# Patient Record
Sex: Male | Born: 1955 | Race: White | Hispanic: No | Marital: Married | Smoking: Never smoker
Health system: Southern US, Community
[De-identification: ages and names within clinical notes are randomized; demographics above are authoritative.]

## PROBLEM LIST (undated history)

## (undated) DIAGNOSIS — Z973 Presence of spectacles and contact lenses: Secondary | ICD-10-CM

## (undated) DIAGNOSIS — E039 Hypothyroidism, unspecified: Secondary | ICD-10-CM

## (undated) DIAGNOSIS — E785 Hyperlipidemia, unspecified: Secondary | ICD-10-CM

## (undated) DIAGNOSIS — M1990 Unspecified osteoarthritis, unspecified site: Secondary | ICD-10-CM

## (undated) DIAGNOSIS — I10 Essential (primary) hypertension: Secondary | ICD-10-CM

## (undated) DIAGNOSIS — Z87442 Personal history of urinary calculi: Secondary | ICD-10-CM

## (undated) HISTORY — DX: Unspecified osteoarthritis, unspecified site: M19.90

## (undated) HISTORY — PX: URETEROSCOPY: SHX842

## (undated) HISTORY — DX: Hyperlipidemia, unspecified: E78.5

## (undated) HISTORY — PX: TONSILLECTOMY: SUR1361

## (undated) HISTORY — DX: Essential (primary) hypertension: I10

## (undated) HISTORY — PX: COLONOSCOPY: SHX174

---

## 2000-08-11 ENCOUNTER — Encounter: Payer: Self-pay | Admitting: Emergency Medicine

## 2000-08-11 ENCOUNTER — Emergency Department (HOSPITAL_COMMUNITY): Admission: EM | Admit: 2000-08-11 | Discharge: 2000-08-11 | Payer: Self-pay | Admitting: Emergency Medicine

## 2002-05-23 ENCOUNTER — Emergency Department (HOSPITAL_COMMUNITY): Admission: EM | Admit: 2002-05-23 | Discharge: 2002-05-23 | Payer: Self-pay | Admitting: Emergency Medicine

## 2002-05-23 ENCOUNTER — Encounter: Payer: Self-pay | Admitting: Emergency Medicine

## 2005-12-12 ENCOUNTER — Emergency Department (HOSPITAL_COMMUNITY): Admission: EM | Admit: 2005-12-12 | Discharge: 2005-12-12 | Payer: Self-pay | Admitting: Emergency Medicine

## 2007-04-01 HISTORY — PX: HERNIA REPAIR: SHX51

## 2007-04-14 ENCOUNTER — Emergency Department (HOSPITAL_COMMUNITY): Admission: EM | Admit: 2007-04-14 | Discharge: 2007-04-14 | Payer: Self-pay | Admitting: Emergency Medicine

## 2007-04-15 ENCOUNTER — Ambulatory Visit: Payer: Self-pay | Admitting: Vascular Surgery

## 2007-04-15 ENCOUNTER — Encounter (INDEPENDENT_AMBULATORY_CARE_PROVIDER_SITE_OTHER): Payer: Self-pay | Admitting: Emergency Medicine

## 2007-04-15 ENCOUNTER — Ambulatory Visit (HOSPITAL_COMMUNITY): Admission: RE | Admit: 2007-04-15 | Discharge: 2007-04-15 | Payer: Self-pay | Admitting: Emergency Medicine

## 2007-11-25 ENCOUNTER — Ambulatory Visit (HOSPITAL_COMMUNITY): Admission: RE | Admit: 2007-11-25 | Discharge: 2007-11-25 | Payer: Self-pay | Admitting: General Surgery

## 2008-02-02 ENCOUNTER — Encounter: Admission: RE | Admit: 2008-02-02 | Discharge: 2008-02-02 | Payer: Self-pay | Admitting: Rheumatology

## 2008-02-03 ENCOUNTER — Encounter: Admission: RE | Admit: 2008-02-03 | Discharge: 2008-02-03 | Payer: Self-pay | Admitting: Rheumatology

## 2008-02-22 ENCOUNTER — Encounter: Admission: RE | Admit: 2008-02-22 | Discharge: 2008-02-22 | Payer: Self-pay | Admitting: Rheumatology

## 2010-04-19 ENCOUNTER — Emergency Department (HOSPITAL_COMMUNITY)
Admission: EM | Admit: 2010-04-19 | Discharge: 2010-04-19 | Payer: Self-pay | Source: Home / Self Care | Admitting: Emergency Medicine

## 2010-04-22 LAB — CBC
HCT: 44.3 % (ref 39.0–52.0)
MCV: 84.9 fL (ref 78.0–100.0)
Platelets: 185 10*3/uL (ref 150–400)
RBC: 5.22 MIL/uL (ref 4.22–5.81)
WBC: 11.2 10*3/uL — ABNORMAL HIGH (ref 4.0–10.5)

## 2010-04-22 LAB — BASIC METABOLIC PANEL
Calcium: 9.1 mg/dL (ref 8.4–10.5)
Chloride: 107 mEq/L (ref 96–112)
GFR calc non Af Amer: 57 mL/min — ABNORMAL LOW (ref >60)
Glucose, Bld: 106 mg/dL — ABNORMAL HIGH (ref 70–99)
Potassium: 4.3 mEq/L (ref 3.5–5.1)

## 2010-04-22 LAB — DIFFERENTIAL
Lymphocytes Relative: 20 % (ref 12–46)
Lymphs Abs: 2.2 10*3/uL (ref 0.7–4.0)
Neutrophils Relative %: 65 % (ref 43–77)

## 2010-04-22 LAB — URINALYSIS, ROUTINE W REFLEX MICROSCOPIC
Bilirubin Urine: NEGATIVE
Ketones, ur: NEGATIVE mg/dL
Urine Glucose, Fasting: NEGATIVE mg/dL
pH: 6 (ref 5.0–8.0)

## 2010-04-22 LAB — URINE MICROSCOPIC-ADD ON

## 2010-08-13 NOTE — Op Note (Signed)
[name], [name]                ACCOUNT NO.:  [id number]   MEDICAL RECORD NO.:  [id number]          PATIENT TYPE:  AMB   LOCATION:  SDS                          FACILITY:  [facility]   PHYSICIAN:  Gabrielle Dare. Janee Morn, M.D.DATE OF BIRTH:  Mar 06, 1956   DATE OF PROCEDURE:  11/25/2007  DATE OF DISCHARGE:  11/25/2007                               OPERATIVE REPORT   PREOPERATIVE DIAGNOSIS:  Umbilical hernia.   POSTOPERATIVE DIAGNOSIS:  Umbilical hernia.   PROCEDURE:  Repair of umbilical hernia with mesh.   SURGEON:  Gabrielle Dare. Janee Morn, MD   ANESTHESIA:  General with laryngeal mask airway   HISTORY OF PRESENT ILLNESS:  Joseph Noble is a 55 year old gentleman who I  evaluated in the office for a symptomatic umbilical hernia.  He presents  today for elective repair with mesh.   PROCEDURE IN DETAIL:  Informed consent was obtained after identifying  the patient in preop holding area.  He received intravenous antibiotics.  He was brought to the operating room.  General anesthesia with laryngeal  mask airway was administered by the anesthesia staff.  His abdomen was  prepped and draped in a sterile fashion.  The infraumbilical area was  infiltrated with 0.25% Marcaine with epinephrine and infraumbilical  incision was made.  Subcutaneous tissues were dissected down and around  the umbilicus.  The umbilicus was circumferentially dissected.  The  umbilical skin was then carefully dissected off of the underlying hernia  sac.  The hernia sac was identified.  It contained a significant portion  of omentum.  The hernia sac was excised and discarded.  The omentum was  a bit unhealthy at the outer tips, so 2 small areas were clamped with  hemostats, excised, and tied with 2-0 Vicryl to achieve excellent  hemostasis.  Remainder of the omentum was viable and reduced easily back  into the abdomen.  The fascia was cleared off circumferentially above  and below to facilitate our inlay mesh repair.  The repair  was then done  with an inlay polypropylene mesh.  This was fashioned to shape to allow  at least a 1.5-cm circumferential inlay overlap.  The fascial defect  itself was only about a centimeter across.  The mesh was then secured in  an inlay fashion with interrupted 0 Prolene sutures.  Two additional  sutures were placed closing the fascia over the top of the mesh and  tacking this down to the mesh as well, achieving excellent closure.  Additional local anesthetic was injected.  The area was copiously  irrigated.  Meticulous hemostasis was obtained.  The umbilicus was then  tacked back down to the fascia with interrupted 2-0 Vicryl sutures.  Subcutaneous tissues were closed with interrupted 3-0  Vicryl sutures and the skin was closed with a running 4-0 Monocryl  subcuticular stitch.  Sponge, needle, and instrument counts were  correct.  Benzoin, Steri-Strips, cotton ball, and a sterile dressing  were applied.  The patient tolerated the procedure well without apparent  complication and was taken to recovery room in stable condition.      Gabrielle Dare Janee Morn, M.D.  Electronically Signed     BET/MEDQ  D:  11/25/2007  T:  11/25/2007  Job:  [id number]   cc:   Vikki Ports, M.D.

## 2013-04-07 ENCOUNTER — Telehealth (INDEPENDENT_AMBULATORY_CARE_PROVIDER_SITE_OTHER): Payer: Self-pay

## 2013-04-07 ENCOUNTER — Ambulatory Visit (INDEPENDENT_AMBULATORY_CARE_PROVIDER_SITE_OTHER): Payer: Managed Care, Other (non HMO) | Admitting: General Surgery

## 2013-04-07 ENCOUNTER — Encounter (INDEPENDENT_AMBULATORY_CARE_PROVIDER_SITE_OTHER): Payer: Self-pay

## 2013-04-07 ENCOUNTER — Encounter (INDEPENDENT_AMBULATORY_CARE_PROVIDER_SITE_OTHER): Payer: Self-pay | Admitting: General Surgery

## 2013-04-07 VITALS — BP 140/96 | HR 64 | Temp 98.2°F | Resp 14 | Ht 74.5 in | Wt 282.0 lb

## 2013-04-07 DIAGNOSIS — K4090 Unilateral inguinal hernia, without obstruction or gangrene, not specified as recurrent: Secondary | ICD-10-CM

## 2013-04-07 MED ORDER — HYDROCODONE-ACETAMINOPHEN 10-325 MG PO TABS: 1.0000 | ORAL_TABLET | Freq: Four times a day (QID) | ORAL | Status: DC | PRN

## 2013-04-07 NOTE — Patient Instructions (Signed)
Please call and ask for scheduling

## 2013-04-07 NOTE — Progress Notes (Signed)
Patient ID: Joseph Noble, male   DOB: Jan 31, 1956, 58 y.o.   MRN: [id number]  Chief Complaint  Patient presents with  . New Evaluation    eval possible hernia also ventral diastasis    HPI Joseph Noble is a 58 y.o. male.  Right groin pain after lifting HPI Patient is well-known to me status post umbilical hernia repair with mesh in the past. He has also had several episodes of kidney stones. On a CAT scan in 2012 which he underwent to evaluate his kidney stones, There is a right inguinal hernia present containing fat and a small portion of the bladder. Since that time, he occasionally has pain in his right groin, especially after lifting. He works in [employer] so heavy lifting is often required. When he develops the pain in his groin, he rests after work and applies either ice or heat. This resolves his symptoms. He does notice a swelling in the area at times. When he has the groin pain, he also at times has supraumbilical abdominal pain. This also resolved spontaneously. No constipation. No ongoing urinary symptoms. I was asked to see him in consultation by Dr. Tenny Crawoss regarding possible hernia. Past Medical History  Diagnosis Date  . Arthritis   . Hyperlipidemia   . Hypertension     Past Surgical History  Procedure Laterality Date  . Hernia repair      Family History  Problem Relation Age of Onset  . Cancer Mother     leukemia    Social History History  Substance Use Topics  . Smoking status: Never Smoker   . Smokeless tobacco: Current User    Types: Chew  . Alcohol Use: No    Allergies  Allergen Reactions  . Codeine Itching and Rash    Current Outpatient Prescriptions  Medication Sig Dispense Refill  . allopurinol (ZYLOPRIM) 300 MG tablet       . HYDROcodone-acetaminophen (NORCO) 10-325 MG per tablet Take 1 tablet by mouth every 6 (six) hours as needed (pain).  30 tablet  0  . ibuprofen (ADVIL,MOTRIN) 200 MG tablet Take 200 mg by mouth every 6 (six) hours  as needed.      Marland Kitchen. lisinopril (PRINIVIL,ZESTRIL) 40 MG tablet       . meloxicam (MOBIC) 7.5 MG tablet       . metoprolol succinate (TOPROL-XL) 100 MG 24 hr tablet       . simvastatin (ZOCOR) 40 MG tablet       . traMADol (ULTRAM) 50 MG tablet        No current facility-administered medications for this visit.    Review of Systems Review of Systems  Constitutional: Negative for fever, chills and unexpected weight change.  HENT: Negative for congestion, hearing loss, sore throat, trouble swallowing and voice change.   Eyes: Negative for visual disturbance.  Respiratory: Negative for cough and wheezing.   Cardiovascular: Negative for chest pain, palpitations and leg swelling.  Gastrointestinal: Positive for abdominal pain. Negative for nausea, diarrhea, constipation, blood in stool, anal bleeding and rectal pain.       See history of present illness  Genitourinary: Negative for urgency, frequency, hematuria and difficulty urinating.       No frequency or urgency since kidney stones have resolved  Musculoskeletal: Positive for back pain. Negative for arthralgias.  Skin: Negative for rash and wound.  Neurological: Negative for seizures, syncope, weakness and headaches.  Hematological: Negative for adenopathy. Does not bruise/bleed easily.  Psychiatric/Behavioral: Negative for confusion.  Blood pressure 140/96, pulse 64, temperature 98.2 F (36.8 C), temperature source Temporal, resp. rate 14, height 6' 2.5" (1.892 m), weight 282 lb (127.914 kg).  Physical Exam Physical Exam  Constitutional: He is oriented to person, place, and time. He appears well-developed and well-nourished. No distress.  HENT:  Head: Normocephalic and atraumatic.  Mouth/Throat: Oropharynx is clear and moist. No oropharyngeal exudate.  Eyes: EOM are normal. Pupils are equal, round, and reactive to light.  Neck: Normal range of motion. Neck supple. No tracheal deviation present.  Cardiovascular: Normal rate,  normal heart sounds and intact distal pulses.   Pulmonary/Chest: Effort normal and breath sounds normal. No stridor. No respiratory distress. He has no wheezes. He has no rales.  Abdominal: Soft. He exhibits no distension. There is no tenderness. There is no rebound and no guarding.  Umbilical hernia repair remains intact, no upper midline or lower midline hernias, no significant rectus diastasis  Genitourinary: Penis normal. No penile tenderness.  Moderate right inguinal hernia, reduces easily but spontaneously recurs, does not extend into the scrotum, no left inguinal hernia, bilateral testes are descended and nonedematous  Neurological: He is alert and oriented to person, place, and time.  Skin: Skin is warm.  Psychiatric: He has a normal mood and affect.    Data Reviewed Previous CT scan, office notes from Dr. Tenny Crawoss  Assessment    Increasingly symptomatic right inguinal hernia, Also contains a small portion of his bladder    Plan    I have offered right inguinal hernia repair with mesh. Procedure, risks, and benefits were discussed in detail with him. He'll need to avoid heavy lifting for 6 weeks after surgery. In light of his job this will require being out of work for that period of time. He would like to schedule for later this month. He needs to check with his employer and he will call tomorrow.       Rawlin Reaume E 04/07/2013, 4:34 PM

## 2013-04-07 NOTE — Telephone Encounter (Signed)
Called and spoke to pt to let him know to stop his Advil and Mobic 5 days before surgery

## 2013-04-08 ENCOUNTER — Telehealth (INDEPENDENT_AMBULATORY_CARE_PROVIDER_SITE_OTHER): Payer: Self-pay

## 2013-04-08 NOTE — Telephone Encounter (Signed)
Message copied by Ivory BroadGLASPEY, Cardarius Senat on Fri Apr 08, 2013  4:05 PM ------      Message from: Joseph Noble, Joseph Noble      Created: Fri Apr 08, 2013 12:17 PM      Regarding: Pt Requesting A Call Back      Contact: [phone]       Pt is upset because he has been leaving voice messages @ sx scheduling and no one has responded to his calls.  Pt said Dr. Janee Mornhompson told him there is a time blocked on 04/21/13 for his sx.      Pt is extremely upset and would like to have his sx date scheduled.       Pt is requesting a c/b from you Joseph Dec(Aanya Haynes) today!!!... DF ------

## 2013-04-08 NOTE — Telephone Encounter (Signed)
I returned the patient's call.  [facility]  She advised him of his financial portion he must pay 1st.  He can't come by until Monday but will come then and pay.  He asked her to hold his spot for him on 1/22 and he will get all the other details about his surgery date/time on Monday.

## 2013-04-15 ENCOUNTER — Telehealth (INDEPENDENT_AMBULATORY_CARE_PROVIDER_SITE_OTHER): Payer: Self-pay | Admitting: General Surgery

## 2013-04-15 ENCOUNTER — Encounter (HOSPITAL_BASED_OUTPATIENT_CLINIC_OR_DEPARTMENT_OTHER): Payer: Self-pay | Admitting: *Deleted

## 2013-04-15 NOTE — Progress Notes (Signed)
Had labs dr Caren Hazyross-will come in for ekg Very nervous and hates needles

## 2013-04-15 NOTE — Telephone Encounter (Signed)
Pt called to confirm he will hold his Mobic for 5 days prior to surgery.  Reminded him to also avoid Aleve, Advil and ASA until after surgery is finished.  He reviewed his instruction for pre- and post-op.  Pt slightly nervous about all, so emotional support offered.

## 2013-04-15 NOTE — Progress Notes (Signed)
04/15/13 1219  OBSTRUCTIVE SLEEP APNEA  Have you ever been diagnosed with sleep apnea through a sleep study? No  Do you snore loudly (loud enough to be heard through closed doors)?  0  Do you often feel tired, fatigued, or sleepy during the daytime? 0  Has anyone observed you stop breathing during your sleep? 0  Do you have, or are you being treated for high blood pressure? 1  BMI more than 35 kg/m2? 1  Age over 58 years old? 1  Neck circumference greater than 40 cm/18 inches? 1  Gender: 1  Obstructive Sleep Apnea Score 5  Score 4 or greater  Results sent to PCP

## 2013-04-18 ENCOUNTER — Telehealth (INDEPENDENT_AMBULATORY_CARE_PROVIDER_SITE_OTHER): Payer: Self-pay

## 2013-04-18 NOTE — Telephone Encounter (Signed)
The pt called to see if he can brush his teeth preop.  He uses mouth was too.  I told him he can just to make sure he doesn't swallow any water or mouth wash.  He also asked about being out of work for 6 weeks and not lifting heavy things.  He confirmed that he will be out of work for 6 weeks.  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  They have no options for light duty.  I told him Dr Janee Mornhompson will write him a note to say no work and no lifting for 6 weeks.

## 2013-04-19 ENCOUNTER — Encounter (HOSPITAL_BASED_OUTPATIENT_CLINIC_OR_DEPARTMENT_OTHER)
Admission: RE | Admit: 2013-04-19 | Discharge: 2013-04-19 | Disposition: A | Discharge status: Home / Self Care | Payer: Managed Care, Other (non HMO) | Source: Ambulatory Visit | Attending: General Surgery | Admitting: General Surgery

## 2013-04-21 ENCOUNTER — Ambulatory Visit (HOSPITAL_BASED_OUTPATIENT_CLINIC_OR_DEPARTMENT_OTHER): Payer: Managed Care, Other (non HMO) | Admitting: Anesthesiology

## 2013-04-21 ENCOUNTER — Encounter (HOSPITAL_BASED_OUTPATIENT_CLINIC_OR_DEPARTMENT_OTHER): Payer: Self-pay | Admitting: Anesthesiology

## 2013-04-21 ENCOUNTER — Encounter (HOSPITAL_BASED_OUTPATIENT_CLINIC_OR_DEPARTMENT_OTHER): Payer: Managed Care, Other (non HMO) | Admitting: Anesthesiology

## 2013-04-21 ENCOUNTER — Encounter (HOSPITAL_BASED_OUTPATIENT_CLINIC_OR_DEPARTMENT_OTHER)
Admission: RE | Disposition: A | Discharge status: Home / Self Care | Payer: Self-pay | Source: Ambulatory Visit | Attending: General Surgery

## 2013-04-21 ENCOUNTER — Ambulatory Visit (HOSPITAL_BASED_OUTPATIENT_CLINIC_OR_DEPARTMENT_OTHER)
Admission: RE | Admit: 2013-04-21 | Discharge: 2013-04-21 | Disposition: A | Discharge status: Home / Self Care | Payer: Managed Care, Other (non HMO) | Source: Ambulatory Visit | Attending: General Surgery | Admitting: General Surgery

## 2013-04-21 DIAGNOSIS — K4090 Unilateral inguinal hernia, without obstruction or gangrene, not specified as recurrent: Secondary | ICD-10-CM

## 2013-04-21 DIAGNOSIS — Z01810 Encounter for preprocedural cardiovascular examination: Secondary | ICD-10-CM | POA: Insufficient documentation

## 2013-04-21 DIAGNOSIS — I10 Essential (primary) hypertension: Secondary | ICD-10-CM | POA: Insufficient documentation

## 2013-04-21 HISTORY — PX: INSERTION OF MESH: SHX5868

## 2013-04-21 HISTORY — PX: INGUINAL HERNIA REPAIR: SHX194

## 2013-04-21 HISTORY — DX: Presence of spectacles and contact lenses: Z97.3

## 2013-04-21 LAB — POCT HEMOGLOBIN-HEMACUE: Hemoglobin: 15.7 g/dL (ref 13.0–17.0)

## 2013-04-21 SURGERY — REPAIR, HERNIA, INGUINAL, ADULT
Anesthesia: Regional | Site: Groin | Laterality: Right

## 2013-04-21 MED ORDER — FENTANYL CITRATE 0.05 MG/ML IJ SOLN
INTRAMUSCULAR | Status: AC
  Filled 2013-04-21: qty 2

## 2013-04-21 MED ORDER — DEXTROSE 5 % IV SOLN
3.0000 g | INTRAVENOUS | Status: AC
  Administered 2013-04-21: 3 g via INTRAVENOUS

## 2013-04-21 MED ORDER — PROMETHAZINE HCL 25 MG/ML IJ SOLN
6.2500 mg | Freq: Four times a day (QID) | INTRAMUSCULAR | Status: DC | PRN
  Administered 2013-04-21: 6.25 mg via INTRAVENOUS
  Filled 2013-04-21: qty 1

## 2013-04-21 MED ORDER — CEFAZOLIN SODIUM-DEXTROSE 2-3 GM-% IV SOLR
INTRAVENOUS | Status: AC
  Filled 2013-04-21: qty 50

## 2013-04-21 MED ORDER — EPHEDRINE SULFATE 50 MG/ML IJ SOLN
INTRAMUSCULAR | Status: DC | PRN
  Administered 2013-04-21 (×2): 10 mg via INTRAVENOUS

## 2013-04-21 MED ORDER — MIDAZOLAM HCL 2 MG/2ML IJ SOLN
INTRAMUSCULAR | Status: AC
  Filled 2013-04-21: qty 2

## 2013-04-21 MED ORDER — BUPIVACAINE-EPINEPHRINE 0.25% -1:200000 IJ SOLN
INTRAMUSCULAR | Status: DC | PRN
  Administered 2013-04-21: 10 mL

## 2013-04-21 MED ORDER — PROPOFOL 10 MG/ML IV BOLUS
INTRAVENOUS | Status: DC | PRN
  Administered 2013-04-21 (×2): 20 mg via INTRAVENOUS
  Administered 2013-04-21: 200 mg via INTRAVENOUS

## 2013-04-21 MED ORDER — DEXAMETHASONE SODIUM PHOSPHATE 4 MG/ML IJ SOLN
INTRAMUSCULAR | Status: DC | PRN
  Administered 2013-04-21: 10 mg via INTRAVENOUS

## 2013-04-21 MED ORDER — CHLORHEXIDINE GLUCONATE 4 % EX LIQD: 1.0000 "application " | Freq: Once | CUTANEOUS | Status: DC

## 2013-04-21 MED ORDER — HYDROMORPHONE HCL PF 1 MG/ML IJ SOLN
0.2500 mg | INTRAMUSCULAR | Status: DC | PRN
  Administered 2013-04-21: 0.5 mg via INTRAVENOUS

## 2013-04-21 MED ORDER — OXYCODONE HCL 5 MG PO TABS: 5.0000 mg | ORAL_TABLET | ORAL | Status: DC | PRN

## 2013-04-21 MED ORDER — FENTANYL CITRATE 0.05 MG/ML IJ SOLN
50.0000 ug | INTRAMUSCULAR | Status: DC | PRN
  Administered 2013-04-21: 100 ug via INTRAVENOUS

## 2013-04-21 MED ORDER — HYDROMORPHONE HCL PF 1 MG/ML IJ SOLN
INTRAMUSCULAR | Status: AC
  Filled 2013-04-21: qty 1

## 2013-04-21 MED ORDER — LACTATED RINGERS IV SOLN: INTRAVENOUS | Status: DC

## 2013-04-21 MED ORDER — OXYCODONE HCL 5 MG PO TABS
5.0000 mg | ORAL_TABLET | Freq: Once | ORAL | Status: AC | PRN
  Administered 2013-04-21: 5 mg via ORAL
  Filled 2013-04-21: qty 1

## 2013-04-21 MED ORDER — ONDANSETRON HCL 4 MG/2ML IJ SOLN
INTRAMUSCULAR | Status: DC | PRN
  Administered 2013-04-21: 4 mg via INTRAVENOUS

## 2013-04-21 MED ORDER — ONDANSETRON HCL 4 MG PO TABS: 4.0000 mg | ORAL_TABLET | Freq: Four times a day (QID) | ORAL | Status: DC | PRN

## 2013-04-21 MED ORDER — OXYCODONE HCL 5 MG/5ML PO SOLN: 5.0000 mg | Freq: Once | ORAL | Status: AC | PRN

## 2013-04-21 MED ORDER — LIDOCAINE HCL (CARDIAC) 20 MG/ML IV SOLN
INTRAVENOUS | Status: DC | PRN
  Administered 2013-04-21: 80 mg via INTRAVENOUS

## 2013-04-21 MED ORDER — BUPIVACAINE-EPINEPHRINE PF 0.5-1:200000 % IJ SOLN
INTRAMUSCULAR | Status: DC | PRN
  Administered 2013-04-21: 30 mL

## 2013-04-21 MED ORDER — BUPIVACAINE-EPINEPHRINE PF 0.25-1:200000 % IJ SOLN
INTRAMUSCULAR | Status: AC
  Filled 2013-04-21: qty 30

## 2013-04-21 MED ORDER — FENTANYL CITRATE 0.05 MG/ML IJ SOLN
INTRAMUSCULAR | Status: DC | PRN
Start: 2013-04-21 — End: 2013-04-21
  Administered 2013-04-21: 50 ug via INTRAVENOUS
  Administered 2013-04-21: 25 ug via INTRAVENOUS
  Administered 2013-04-21: 50 ug via INTRAVENOUS
  Administered 2013-04-21: 25 ug via INTRAVENOUS

## 2013-04-21 MED ORDER — LACTATED RINGERS IV SOLN
INTRAVENOUS | Status: DC | PRN
Start: 2013-04-21 — End: 2013-04-21
  Administered 2013-04-21 (×2): via INTRAVENOUS

## 2013-04-21 MED ORDER — MIDAZOLAM HCL 2 MG/2ML IJ SOLN
1.0000 mg | INTRAMUSCULAR | Status: DC | PRN
  Administered 2013-04-21: 2 mg via INTRAVENOUS

## 2013-04-21 MED ORDER — CEFAZOLIN SODIUM 1-5 GM-% IV SOLN
INTRAVENOUS | Status: AC
  Filled 2013-04-21: qty 50

## 2013-04-21 MED ORDER — FENTANYL CITRATE 0.05 MG/ML IJ SOLN
INTRAMUSCULAR | Status: AC
  Filled 2013-04-21: qty 4

## 2013-04-21 MED ORDER — PROPOFOL 10 MG/ML IV BOLUS
INTRAVENOUS | Status: AC
  Filled 2013-04-21: qty 20

## 2013-04-21 SURGICAL SUPPLY — 48 items
ADH SKN CLS APL DERMABOND .7 (GAUZE/BANDAGES/DRESSINGS) ×1
BLADE HEX COATED 2.75 (ELECTRODE) ×3 IMPLANT
BLADE SURG 10 STRL SS (BLADE) ×2 IMPLANT
BLADE SURG 15 STRL LF DISP TIS (BLADE) ×1 IMPLANT
BLADE SURG 15 STRL SS (BLADE) ×2
BLADE SURG ROTATE 9660 (MISCELLANEOUS) ×1 IMPLANT
CANISTER SUCT 1200ML W/VALVE (MISCELLANEOUS) ×2 IMPLANT
CHLORAPREP W/TINT 26ML (MISCELLANEOUS) ×2 IMPLANT
COVER MAYO STAND STRL (DRAPES) ×2 IMPLANT
COVER TABLE BACK 60X90 (DRAPES) ×2 IMPLANT
DECANTER SPIKE VIAL GLASS SM (MISCELLANEOUS) ×1 IMPLANT
DERMABOND ADVANCED (GAUZE/BANDAGES/DRESSINGS) ×1
DERMABOND ADVANCED .7 DNX12 (GAUZE/BANDAGES/DRESSINGS) ×1 IMPLANT
DRAIN PENROSE 1/2X12 LTX STRL (WOUND CARE) ×2 IMPLANT
DRAPE PED LAPAROTOMY (DRAPES) ×2 IMPLANT
DRAPE UTILITY XL STRL (DRAPES) ×2 IMPLANT
ELECT REM PT RETURN 9FT ADLT (ELECTROSURGICAL) ×2
ELECTRODE REM PT RTRN 9FT ADLT (ELECTROSURGICAL) ×1 IMPLANT
GLOVE BIO SURGEON STRL SZ8 (GLOVE) ×2 IMPLANT
GLOVE BIOGEL PI IND STRL 8 (GLOVE) ×1 IMPLANT
GLOVE BIOGEL PI INDICATOR 8 (GLOVE) ×2
GLOVE SURG SS PI 8.0 STRL IVOR (GLOVE) ×2 IMPLANT
GOWN STRL REUS W/ TWL LRG LVL3 (GOWN DISPOSABLE) ×1 IMPLANT
GOWN STRL REUS W/ TWL XL LVL3 (GOWN DISPOSABLE) ×1 IMPLANT
GOWN STRL REUS W/TWL LRG LVL3 (GOWN DISPOSABLE)
GOWN STRL REUS W/TWL XL LVL3 (GOWN DISPOSABLE) ×4
MESH HERNIA 3X6 (Mesh General) ×1 IMPLANT
NDL HYPO 25X1 1.5 SAFETY (NEEDLE) ×1 IMPLANT
NEEDLE HYPO 25X1 1.5 SAFETY (NEEDLE) ×2 IMPLANT
NS IRRIG 1000ML POUR BTL (IV SOLUTION) ×1 IMPLANT
PACK BASIN DAY SURGERY FS (CUSTOM PROCEDURE TRAY) ×2 IMPLANT
PENCIL BUTTON HOLSTER BLD 10FT (ELECTRODE) ×2 IMPLANT
SLEEVE SCD COMPRESS KNEE MED (MISCELLANEOUS) ×1 IMPLANT
SPONGE LAP 18X18 X RAY DECT (DISPOSABLE) IMPLANT
SPONGE LAP 4X18 X RAY DECT (DISPOSABLE) ×2 IMPLANT
SUT MON AB 4-0 PC3 18 (SUTURE) ×2 IMPLANT
SUT PROLENE 0 CT 2 (SUTURE) ×7 IMPLANT
SUT VIC AB 2-0 SH 27 (SUTURE) ×6
SUT VIC AB 2-0 SH 27XBRD (SUTURE) ×2 IMPLANT
SUT VIC AB 3-0 SH 27 (SUTURE) ×2
SUT VIC AB 3-0 SH 27X BRD (SUTURE) ×1 IMPLANT
SUT VICRYL AB 2 0 TIE (SUTURE) IMPLANT
SUT VICRYL AB 2 0 TIES (SUTURE) ×4
SYR CONTROL 10ML LL (SYRINGE) ×2 IMPLANT
TOWEL OR 17X24 6PK STRL BLUE (TOWEL DISPOSABLE) ×3 IMPLANT
TOWEL OR NON WOVEN STRL DISP B (DISPOSABLE) ×2 IMPLANT
TUBE CONNECTING 20X1/4 (TUBING) ×2 IMPLANT
YANKAUER SUCT BULB TIP NO VENT (SUCTIONS) ×2 IMPLANT

## 2013-04-21 NOTE — Progress Notes (Signed)
Assisted Dr. Fitzgerald with right, ultrasound guided, transabdominal plane block. Side rails up, monitors on throughout procedure. See vital signs in flow sheet. Tolerated Procedure well. 

## 2013-04-21 NOTE — Anesthesia Preprocedure Evaluation (Addendum)
Anesthesia Evaluation  Patient identified by MRN, date of birth, ID band Patient awake    Reviewed: Allergy & Precautions, H&P , NPO status , Patient's Chart, lab work & pertinent test results, reviewed documented beta blocker date and time   History of Anesthesia Complications Negative for: history of anesthetic complications  Airway       Dental   Pulmonary neg pulmonary ROS,          Cardiovascular hypertension, Pt. on medications and Pt. on home beta blockers     Neuro/Psych negative neurological ROS  negative psych ROS   GI/Hepatic   Endo/Other    Renal/GU      Musculoskeletal   Abdominal   Peds  Hematology   Anesthesia Other Findings   Reproductive/Obstetrics                           Anesthesia Physical Anesthesia Plan Anesthesia Quick Evaluation

## 2013-04-21 NOTE — Anesthesia Procedure Notes (Addendum)
Anesthesia Regional Block:  TAP block  Pre-Anesthetic Checklist: ,, timeout performed, Correct Patient, Correct Site, Correct Laterality, Correct Procedure, Correct Position, site marked, Risks and benefits discussed, pre-op evaluation, post-op pain management  Laterality: Right  Prep: Maximum Sterile Barrier Precautions used and chloraprep       Needles:  Injection technique: Single-shot  Needle Type: Echogenic Stimulator Needle      Needle Gauge: 21 and 21 G    Additional Needles:  Procedures: ultrasound guided (picture in chart) TAP block Narrative:  Start time: 04/21/2013 7:11 AM End time: 04/21/2013 7:22 AM Injection made incrementally with aspirations every 5 mL. Anesthesiologist: Fitzgerald,MD  Additional Notes: 2% Lidocaine skin wheel.   Date/Time: 04/21/2013 7:42 AM Performed by: Signa KellONRAD, Jaclene Bartelt C    Procedure Name: LMA Insertion Date/Time: 04/21/2013 7:42 AM Performed by: Burna CashONRAD, Necole Minassian C Pre-anesthesia Checklist: Patient identified, Emergency Drugs available, Suction available and Patient being monitored Patient Re-evaluated:Patient Re-evaluated prior to inductionOxygen Delivery Method: Circle System Utilized Preoxygenation: Pre-oxygenation with 100% oxygen Intubation Type: IV induction Ventilation: Mask ventilation without difficulty LMA: LMA inserted LMA Size: 5.0 Number of attempts: 1 Airway Equipment and Method: bite block Placement Confirmation: positive ETCO2 Tube secured with: Tape Dental Injury: Teeth and Oropharynx as per pre-operative assessment

## 2013-04-21 NOTE — Transfer of Care (Signed)
Immediate Anesthesia Transfer of Care Note  Patient: Joseph HawkingRobert J Noble  Procedure(s) Performed: Procedure(s): HERNIA REPAIR INGUINAL ADULT (Right) INSERTION OF MESH (Right)  Patient Location: PACU  Anesthesia Type:GA combined with regional for post-op pain  Level of Consciousness: sedated  Airway & Oxygen Therapy: Patient Spontanous Breathing and Patient connected to face mask oxygen  Post-op Assessment: Report given to PACU RN and Post -op Vital signs reviewed and stable  Post vital signs: Reviewed and stable  Complications: No apparent anesthesia complications

## 2013-04-21 NOTE — Anesthesia Postprocedure Evaluation (Signed)
  Anesthesia Post-op Note  Patient: Henrene HawkingRobert J Voller  Procedure(s) Performed: Procedure(s): HERNIA REPAIR INGUINAL ADULT (Right) INSERTION OF MESH (Right)  Patient Location: PACU  Anesthesia Type:General and TAP block  Level of Consciousness: awake and alert   Airway and Oxygen Therapy: Patient Spontanous Breathing  Post-op Pain: mild  Post-op Assessment: Post-op Vital signs reviewed, Patient's Cardiovascular Status Stable and Respiratory Function Stable  Post-op Vital Signs: Reviewed  Filed Vitals:   04/21/13 1000  BP: 170/99  Pulse: 62  Temp:   Resp: 19    Complications: No apparent anesthesia complications

## 2013-04-21 NOTE — Interval H&P Note (Signed)
History and Physical Interval Note:  04/21/2013 7:23 AM  Joseph Noble  has presented today for surgery, with the diagnosis of inguinal hernia   The various methods of treatment have been discussed with the patient and family. After consideration of risks, benefits and other options for treatment, the patient has consented to  Procedure(s): HERNIA REPAIR INGUINAL ADULT (Right) INSERTION OF MESH (Right) as a surgical intervention .  The patient's history has been reviewed, patient re-examined, site marked,no change in status, stable for surgery.  I have reviewed the patient's chart and labs.  Questions were answered to the patient's satisfaction.     Albaro Deviney E

## 2013-04-21 NOTE — H&P (View-Only) (Signed)
Patient ID: Joseph HawkingRobert J Kennan, male   DOB: Jan 31, 1956, 58 y.o.   MRN: [id number]  Chief Complaint  Patient presents with  . New Evaluation    eval possible hernia also ventral diastasis    HPI Joseph Noble is a 58 y.o. male.  Right groin pain after lifting HPI Patient is well-known to me status post umbilical hernia repair with mesh in the past. He has also had several episodes of kidney stones. On a CAT scan in 2012 which he underwent to evaluate his kidney stones, There is a right inguinal hernia present containing fat and a small portion of the bladder. Since that time, he occasionally has pain in his right groin, especially after lifting. He works in [employer] so heavy lifting is often required. When he develops the pain in his groin, he rests after work and applies either ice or heat. This resolves his symptoms. He does notice a swelling in the area at times. When he has the groin pain, he also at times has supraumbilical abdominal pain. This also resolved spontaneously. No constipation. No ongoing urinary symptoms. I was asked to see him in consultation by Dr. Tenny Crawoss regarding possible hernia. Past Medical History  Diagnosis Date  . Arthritis   . Hyperlipidemia   . Hypertension     Past Surgical History  Procedure Laterality Date  . Hernia repair      Family History  Problem Relation Age of Onset  . Cancer Mother     leukemia    Social History History  Substance Use Topics  . Smoking status: Never Smoker   . Smokeless tobacco: Current User    Types: Chew  . Alcohol Use: No    Allergies  Allergen Reactions  . Codeine Itching and Rash    Current Outpatient Prescriptions  Medication Sig Dispense Refill  . allopurinol (ZYLOPRIM) 300 MG tablet       . HYDROcodone-acetaminophen (NORCO) 10-325 MG per tablet Take 1 tablet by mouth every 6 (six) hours as needed (pain).  30 tablet  0  . ibuprofen (ADVIL,MOTRIN) 200 MG tablet Take 200 mg by mouth every 6 (six) hours  as needed.      Marland Kitchen. lisinopril (PRINIVIL,ZESTRIL) 40 MG tablet       . meloxicam (MOBIC) 7.5 MG tablet       . metoprolol succinate (TOPROL-XL) 100 MG 24 hr tablet       . simvastatin (ZOCOR) 40 MG tablet       . traMADol (ULTRAM) 50 MG tablet        No current facility-administered medications for this visit.    Review of Systems Review of Systems  Constitutional: Negative for fever, chills and unexpected weight change.  HENT: Negative for congestion, hearing loss, sore throat, trouble swallowing and voice change.   Eyes: Negative for visual disturbance.  Respiratory: Negative for cough and wheezing.   Cardiovascular: Negative for chest pain, palpitations and leg swelling.  Gastrointestinal: Positive for abdominal pain. Negative for nausea, diarrhea, constipation, blood in stool, anal bleeding and rectal pain.       See history of present illness  Genitourinary: Negative for urgency, frequency, hematuria and difficulty urinating.       No frequency or urgency since kidney stones have resolved  Musculoskeletal: Positive for back pain. Negative for arthralgias.  Skin: Negative for rash and wound.  Neurological: Negative for seizures, syncope, weakness and headaches.  Hematological: Negative for adenopathy. Does not bruise/bleed easily.  Psychiatric/Behavioral: Negative for confusion.  Blood pressure 140/96, pulse 64, temperature 98.2 F (36.8 C), temperature source Temporal, resp. rate 14, height 6' 2.5" (1.892 m), weight 282 lb (127.914 kg).  Physical Exam Physical Exam  Constitutional: He is oriented to person, place, and time. He appears well-developed and well-nourished. No distress.  HENT:  Head: Normocephalic and atraumatic.  Mouth/Throat: Oropharynx is clear and moist. No oropharyngeal exudate.  Eyes: EOM are normal. Pupils are equal, round, and reactive to light.  Neck: Normal range of motion. Neck supple. No tracheal deviation present.  Cardiovascular: Normal rate,  normal heart sounds and intact distal pulses.   Pulmonary/Chest: Effort normal and breath sounds normal. No stridor. No respiratory distress. He has no wheezes. He has no rales.  Abdominal: Soft. He exhibits no distension. There is no tenderness. There is no rebound and no guarding.  Umbilical hernia repair remains intact, no upper midline or lower midline hernias, no significant rectus diastasis  Genitourinary: Penis normal. No penile tenderness.  Moderate right inguinal hernia, reduces easily but spontaneously recurs, does not extend into the scrotum, no left inguinal hernia, bilateral testes are descended and nonedematous  Neurological: He is alert and oriented to person, place, and time.  Skin: Skin is warm.  Psychiatric: He has a normal mood and affect.    Data Reviewed Previous CT scan, office notes from Dr. Tenny Crawoss  Assessment    Increasingly symptomatic right inguinal hernia, Also contains a small portion of his bladder    Plan    I have offered right inguinal hernia repair with mesh. Procedure, risks, and benefits were discussed in detail with him. He'll need to avoid heavy lifting for 6 weeks after surgery. In light of his job this will require being out of work for that period of time. He would like to schedule for later this month. He needs to check with his employer and he will call tomorrow.       Tandre Conly E 04/07/2013, 4:34 PM

## 2013-04-21 NOTE — Op Note (Signed)
04/21/2013  9:18 AM  PATIENT:  Joseph Noble  58 y.o. male  PRE-OPERATIVE DIAGNOSIS:  right inguinal hernia   POST-OPERATIVE DIAGNOSIS:  right inguinal hernia   PROCEDURE:  Procedure(s): HERNIA REPAIR INGUINAL ADULT INSERTION OF MESH  SURGEON:  Surgeon(s): Joseph MaladyBurke E Kaylee Trivett, MD  PHYSICIAN ASSISTANT:   ASSISTANTS: none   ANESTHESIA:   local, regional and general  EBL:  Total I/O In: 1000 [I.V.:1000] Out: -   BLOOD ADMINISTERED:none  DRAINS: none   SPECIMEN:  No Specimen  DISPOSITION OF SPECIMEN:  N/A  COUNTS:  YES  DICTATION: .Dragon Dictation Patient presents for Repair of right inguinal hernia with mesh. He was identified in preop holding. Informed consent was obtained. His site was marked. He received intravenous antibiotics. He underwent TAP block by anesthesia.He was brought to the operating room. General anesthesia with laryngeal mask airway was administered by the anesthesia staff. Right groin was prepped and draped in sterile fashion. We did time out procedure. Right groin was injected with local along the planned line of incision. Right inguinal incision was made. Subcutaneous tissues were dissected down through Scarpa's fascia revealing the external oblique fascia. This was attenuated medially T2 this large hernia. The external oblique was divided laterally in the superior leaflet was dissected free off the transversalis. The inferior leaflet was dissected down revealing the shelving edge of inguinal ligament. Cord structures were encircled with a Penrose drain. There was a large indirect hernia. A small portion of his bladder that was involved in his hernia on CT scan from 2 years ago appeared spontaneously reduced. The cord was carefully dissected and the cord structures were dissected free from the hernia sac. 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 The cord lipoma was removed bySequential clamping with hemostats and tying with Vicryl sutures. This was discarded. Hernia  sac contained only omentum. This was freed from the cord structures and, due to the chronic nature of the hernia, the omentum was excised as above. Hernia sac was ligated with 2-0 Vicryl. Cord structures remained intact. Right testicle was positioned back down into the scrotum. The hernia was repaired with a keyhole polypropylene mesh. This was cut to custom shape and size. The mesh was secured to the Tissues over the pubic tubercle medially and shelving edge of inguinal ligament inferiorly with running 0 Prolene. Interrupted 0 point then used to tack the mesh to the tissues over the pubic tubercle medially and extending out laterally along the transversalis. The 2 leaflets of the mesh were rejoined behind the cord structures to reconstruct the ring. These were tacked together and down to the underlying musculature. The leaflets were left long and tucked out laterally underneath the lateral external oblique fascia. Area was copiously irrigated. Meticulous hemostasis was obtained. External oblique fascia was closed with running 2-0 Vicryl. Scarpa's fascia was closed with interrupted 3-0 Vicryl. Skin was closed with running 4-0 Monocryl followed by Dermabond. All counts were correct. Patient tolerated procedure well without apparent complication was taken recovery in stable condition.  PATIENT DISPOSITION:  PACU - hemodynamically stable.   Delay start of Pharmacological VTE agent (>24hrs) due to surgical blood loss or risk of bleeding:  no  Joseph GelinasBurke [name], MD, MPH, FACS Pager: [phone]  1/22/20159:18 AM

## 2013-04-21 NOTE — Discharge Instructions (Signed)

## 2013-04-22 ENCOUNTER — Telehealth (INDEPENDENT_AMBULATORY_CARE_PROVIDER_SITE_OTHER): Payer: Self-pay

## 2013-04-22 ENCOUNTER — Encounter (HOSPITAL_BASED_OUTPATIENT_CLINIC_OR_DEPARTMENT_OTHER): Payer: Self-pay | Admitting: General Surgery

## 2013-04-22 NOTE — Telephone Encounter (Signed)
I called pt to let him know about his po appointment on 2/16.  I pushed it out to 3 weeks per Dr Janee Mornhompson.  He states he had just called this morning and talked to someone here and said he had some bleeding that was in his underwear from his incision.  He says there were 2 spots about the size of a quarter.  I told him that is ok for now.  There is no bandage over it so I told him to cover with some gauze and keep it covered and watch it.  He doesn't want to worry all weekend and wants to be seen if he needs to.  I said he doesn't at this point.  Keep it covered and keep some pressure on it.  Call us later today if increases and saturates the gauze.  I instructed him on using a pillow for a splint to help with getting out of a chair, coughing and sneezing.  He understands the instructions.

## 2013-04-27 ENCOUNTER — Telehealth (INDEPENDENT_AMBULATORY_CARE_PROVIDER_SITE_OTHER): Payer: Self-pay | Admitting: General Surgery

## 2013-04-27 NOTE — Telephone Encounter (Signed)
Pt called to report he has a cold; he had surgery for RIH last week.  Taking Benadryl for symptoms.  Asking if he needs an antibiotic to prevent infection of his wound.  Reassured pt he does not need prophylactic antibiotic, especially since his cold is a viral infection that will not respond to the medication.  OK to take any of the OTC medicine for colds to manage his symptoms.  Best to maintain his hydration and use strict handwashing after handling mucous.  He understands all .

## 2013-04-28 ENCOUNTER — Telehealth (INDEPENDENT_AMBULATORY_CARE_PROVIDER_SITE_OTHER): Payer: Self-pay | Admitting: General Surgery

## 2013-04-28 NOTE — Telephone Encounter (Signed)
Pt called to ask if he can drive yet.  [facility]  Recommended he wait for another week, at a minimum, to drive.  Reminded him the cannot be taking any narcotic pain medicine and must wear a seatbelt at all time.  He then asked if he can ride in a car now.  He wants to attend a funeral that is approximately a 2.5 hour drive from here.  Instructed him okay be a passenger, but must wear a seatbelt.  Suggested he stop at the halfway mark of his trip to get out and walk around for about 15-20 minutes.  He states he will abide by all instructions.

## 2013-05-02 ENCOUNTER — Telehealth (INDEPENDENT_AMBULATORY_CARE_PROVIDER_SITE_OTHER): Payer: Self-pay | Admitting: *Deleted

## 2013-05-02 NOTE — Telephone Encounter (Signed)
Pt calling in stating that his incision was leaking but he cannot tell the color of the drainage.  He denies any pain or redness at the site.  He thinks he may have done too much this weekend, causing the drainage.  I advised him to place a gauze on this area and to let his wife look at his incision when she gets home and to check the color of the drainage.  I assured him that clear drainage is ok, but if it is yellow or green we would be concerned.  I advised him to take it easy and rest.  He will call back tomorrow if there is anything concerning.

## 2013-05-04 ENCOUNTER — Encounter (INDEPENDENT_AMBULATORY_CARE_PROVIDER_SITE_OTHER): Payer: Managed Care, Other (non HMO) | Admitting: General Surgery

## 2013-05-04 NOTE — Telephone Encounter (Signed)
Patient called back regarding his incision site.  Patient states he is having minimal drainage at this time and it is clear in color.  Explained to patient that this is very normal and expected.  Patient states understanding and agreeable at this time.

## 2013-05-10 ENCOUNTER — Telehealth (INDEPENDENT_AMBULATORY_CARE_PROVIDER_SITE_OTHER): Payer: Self-pay

## 2013-05-10 NOTE — Telephone Encounter (Signed)
Please have SG(urge) or some other kind soul write him for hydrocodone/APAP #40. thanks

## 2013-05-10 NOTE — Telephone Encounter (Signed)
Pt is s/p hernia repair on 04/21/13 by Dr. Janee Mornhompson.  He received a Rx for Oxycodone after surgery but has not taken this.  He does take 800mg  of Ibuprofen daily which he says helps some with the pain.  He would like to know if Dr. Janee Mornhompson would approve a new Rx for Hydrocodone.  I told the pt Dr. Janee Mornhompson was out of the office this week so one of his partners would have to write the Rx if it was approved.  He will be contacted to p/u the Rx.  Pt also wanted to discuss his return to work.  I told him he could do so at his post op appt on 05/16/13.

## 2013-05-11 ENCOUNTER — Other Ambulatory Visit (INDEPENDENT_AMBULATORY_CARE_PROVIDER_SITE_OTHER): Payer: Self-pay

## 2013-05-11 DIAGNOSIS — G8918 Other acute postprocedural pain: Secondary | ICD-10-CM

## 2013-05-11 MED ORDER — HYDROCODONE-ACETAMINOPHEN 5-325 MG PO TABS: 1.0000 | ORAL_TABLET | Freq: Four times a day (QID) | ORAL | Status: DC | PRN

## 2013-05-11 NOTE — Telephone Encounter (Signed)
RX for Norco 5/325 #30 written by Dr Gerrit FriendsGerkin and at front desk for pt to pick up. Pt aware.

## 2013-05-11 NOTE — Telephone Encounter (Signed)
Request for RX to be written given to Dr Gerrit FriendsGerkin to review and write.

## 2013-05-16 ENCOUNTER — Encounter (INDEPENDENT_AMBULATORY_CARE_PROVIDER_SITE_OTHER): Payer: Self-pay | Admitting: General Surgery

## 2013-05-16 ENCOUNTER — Encounter (INDEPENDENT_AMBULATORY_CARE_PROVIDER_SITE_OTHER): Payer: Self-pay

## 2013-05-16 ENCOUNTER — Ambulatory Visit (INDEPENDENT_AMBULATORY_CARE_PROVIDER_SITE_OTHER): Payer: Managed Care, Other (non HMO) | Admitting: General Surgery

## 2013-05-16 VITALS — BP 140/86 | HR 66 | Temp 97.0°F | Resp 16 | Ht 74.0 in | Wt 280.4 lb

## 2013-05-16 DIAGNOSIS — K4090 Unilateral inguinal hernia, without obstruction or gangrene, not specified as recurrent: Secondary | ICD-10-CM

## 2013-05-16 NOTE — Progress Notes (Signed)
Subjective:     Patient ID: Joseph Noble, male   DOB: April 18, 1955, 58 y.o.   MRN: [id number]  HPI Patient is status post right inguinal hernia repair with mesh. He developed a rash after taking oxycodone. He is no longer taking that. He took hydrocodone for a period of time but is out of that. Pain control is adequate.  Review of Systems     Objective:   Physical Exam Right inguinal exam reveals incision is healing well. There is a tiny opening medially which are treated with silver nitrate. No evidence of infection. Hernia repair is intact. Mild edema right testicle without tenderness. Resolving rash bilateral lower extremities with small scabbing secondary to scratching    Assessment:     Coming along well after right inguinal hernia repair with mesh, narcotic-related rash    Plan:     No further narcotics. Manage pain with Advil and Tylenol. I will see him back on March 4 to evaluate if he is ready to return to work on 06/03/2013. Continue to avoid any lifting in the interim.

## 2013-06-01 ENCOUNTER — Encounter (INDEPENDENT_AMBULATORY_CARE_PROVIDER_SITE_OTHER): Payer: Self-pay

## 2013-06-01 ENCOUNTER — Ambulatory Visit (INDEPENDENT_AMBULATORY_CARE_PROVIDER_SITE_OTHER): Payer: Managed Care, Other (non HMO) | Admitting: General Surgery

## 2013-06-01 ENCOUNTER — Encounter (INDEPENDENT_AMBULATORY_CARE_PROVIDER_SITE_OTHER): Payer: Self-pay | Admitting: General Surgery

## 2013-06-01 VITALS — BP 142/90 | HR 84 | Temp 98.5°F | Resp 16 | Ht 74.0 in | Wt 286.0 lb

## 2013-06-01 DIAGNOSIS — K4090 Unilateral inguinal hernia, without obstruction or gangrene, not specified as recurrent: Secondary | ICD-10-CM

## 2013-06-01 NOTE — Progress Notes (Signed)
Subjective:     Patient ID: Joseph Noble, male   DOB: [birth year], 58 y.o.   MRN: [id number]  HPI  Patient presents status post right inguinal hernia repair with mesh on 04/21/2013. He continues to have some pain after walking but all in all, it is better. Review of Systems     Objective:   Physical Exam Wound is clean dry and intact. Hernia repair is intact. Mild residual right testicular edema persists. No evidence of infection or other complicating features.    Assessment:     Gradual improvement status post right inguinal hernia repair with mesh    Plan:    In light of pain with activity, needs a full 2 months before returning to work. His job at [employer] requires a lot of heavy lifting. He is cleared to return to work on 06/20/2013. I also updated his short-term disability form and we will fax that in. I will see him back as needed.

## 2017-07-16 ENCOUNTER — Other Ambulatory Visit: Payer: Self-pay | Admitting: Family Medicine

## 2017-07-16 ENCOUNTER — Ambulatory Visit
Admission: RE | Admit: 2017-07-16 | Discharge: 2017-07-16 | Disposition: A | Discharge status: Home / Self Care | Payer: Managed Care, Other (non HMO) | Source: Ambulatory Visit | Attending: Family Medicine | Admitting: Family Medicine

## 2017-07-16 DIAGNOSIS — R319 Hematuria, unspecified: Secondary | ICD-10-CM

## 2019-04-14 ENCOUNTER — Other Ambulatory Visit: Payer: Managed Care, Other (non HMO)

## 2020-12-04 DIAGNOSIS — M1711 Unilateral primary osteoarthritis, right knee: Secondary | ICD-10-CM | POA: Diagnosis present

## 2020-12-10 NOTE — Patient Instructions (Addendum)
DUE TO COVID-19 ONLY ONE VISITOR IS ALLOWED TO COME WITH YOU AND STAY IN THE WAITING ROOM ONLY DURING PRE OP AND PROCEDURE.   **NO VISITORS ARE ALLOWED IN THE SHORT STAY AREA OR RECOVERY ROOM!!**        Your procedure is scheduled on:  Tuesday, 12-25-20   Report to [facility] Main  Entrance     Report to admitting at 7:15 AM   Call this number if you have problems the morning of surgery [phone]   Do not eat food :After Midnight.   May have liquids until 7:00 AM day of surgery  CLEAR LIQUID DIET  Foods Allowed                                                                     Foods Excluded  Water, Black Coffee (no milk/no creamer) and tea, regular and decaf                              liquids that you cannot  Plain Jell-O in any flavor  (No red)                         see through such as: Fruit ices (not with fruit pulp)                                 milk, soups, orange juice  Iced Popsicles (No red)                                    All solid food                             Apple juices Sports drinks like Gatorade (No red) Lightly seasoned clear broth or consume(fat free) Sugar    Complete one Ensure drink the morning of surgery at 7:00 AM the day of surgery.     The day of surgery:  Drink ONE (1) Pre-Surgery Clear Ensure the morning of surgery. Drink in one sitting. Do not sip.  This drink was given to you during your hospital  pre-op appointment visit. Nothing else to drink after completing the Pre-Surgery Clear Ensure.          If you have questions, please contact your surgeon's office.     Oral Hygiene is also important to reduce your risk of infection.                                    Remember - BRUSH YOUR TEETH THE MORNING OF SURGERY WITH YOUR REGULAR TOOTHPASTE   Do NOT smoke after Midnight  Take these medicines the morning of surgery with A SIP OF WATER:  Allopurinol, Amlodipine, Levothyroxine, Metoprolol, Rosuvastatin. Hydrocodone and  Zofran if needed    Stop all vitamins and herbal supplements a week before surgery  You may not have any metal on your body including  jewelry, and body piercing             Do not wear lotions, powders, cologne, or deodorant              Men may shave face and neck.  Do not bring valuables to the hospital. [facility] IS NOT RESPONSIBLE FOR VALUABLES.   Contacts, dentures or bridgework may not be worn into surgery.   Bring small overnight bag day of surgery.    Patients discharged the day of surgery will not be allowed to drive home.  Please read over the following fact sheets you were given: IF YOU HAVE QUESTIONS ABOUT YOUR PRE OP INSTRUCTIONS PLEASE CALL [phone] [facility] - Preparing for Surgery Before surgery, you can play an important role.  Because skin is not sterile, your skin needs to be as free of germs as possible.  You can reduce the number of germs on your skin by washing with CHG (chlorahexidine gluconate) soap before surgery.  CHG is an antiseptic cleaner which kills germs and bonds with the skin to continue killing germs even after washing. Please DO NOT use if you have an allergy to CHG or antibacterial soaps.  If your skin becomes reddened/irritated stop using the CHG and inform your nurse when you arrive at Short Stay. Do not shave (including legs and underarms) for at least 48 hours prior to the first CHG shower.  You may shave your face/neck.  Please follow these instructions carefully:  1.  Shower with CHG Soap the night before surgery and the  morning of surgery.  2.  If you choose to wash your hair, wash your hair first as usual with your normal  shampoo.  3.  After you shampoo, rinse your hair and body thoroughly to remove the shampoo.                             4.  Use CHG as you would any other liquid soap.  You can apply chg directly to the skin and wash.  Gently with a scrungie or clean washcloth.  5.  Apply the CHG Soap to your  body ONLY FROM THE NECK DOWN.   Do   not use on face/ open                           Wound or open sores. Avoid contact with eyes, ears mouth and   genitals (private parts).                       Wash face,  Genitals (private parts) with your normal soap.             6.  Wash thoroughly, paying special attention to the area where your    surgery  will be performed.  7.  Thoroughly rinse your body with warm water from the neck down.  8.  DO NOT shower/wash with your normal soap after using and rinsing off the CHG Soap.                9.  Pat yourself dry with a clean towel.            10.  Wear clean pajamas.            11.  Place clean sheets  on your bed the night of your first shower and do not  sleep with pets. Day of Surgery : Do not apply any lotions/deodorants the morning of surgery.  Please wear clean clothes to the hospital/surgery center.  FAILURE TO FOLLOW THESE INSTRUCTIONS MAY RESULT IN THE CANCELLATION OF YOUR SURGERY  PATIENT SIGNATURE_________________________________  NURSE SIGNATURE__________________________________  ________________________________________________________________________   Rogelia Mire  An incentive spirometer is a tool that can help keep your lungs clear and active. This tool measures how well you are filling your lungs with each breath. Taking long deep breaths may help reverse or decrease the chance of developing breathing (pulmonary) problems (especially infection) following: A long period of time when you are unable to move or be active. BEFORE THE PROCEDURE  If the spirometer includes an indicator to show your best effort, your nurse or respiratory therapist will set it to a desired goal. If possible, sit up straight or lean slightly forward. Try not to slouch. Hold the incentive spirometer in an upright position. INSTRUCTIONS FOR USE  Sit on the edge of your bed if possible, or sit up as far as you can in bed or on a chair. Hold the  incentive spirometer in an upright position. Breathe out normally. Place the mouthpiece in your mouth and seal your lips tightly around it. Breathe in slowly and as deeply as possible, raising the piston or the ball toward the top of the column. Hold your breath for 3-5 seconds or for as long as possible. Allow the piston or ball to fall to the bottom of the column. Remove the mouthpiece from your mouth and breathe out normally. Rest for a few seconds and repeat Steps 1 through 7 at least 10 times every 1-2 hours when you are awake. Take your time and take a few normal breaths between deep breaths. The spirometer may include an indicator to show your best effort. Use the indicator as a goal to work toward during each repetition. After each set of 10 deep breaths, practice coughing to be sure your lungs are clear. If you have an incision (the cut made at the time of surgery), support your incision when coughing by placing a pillow or rolled up towels firmly against it. Once you are able to get out of bed, walk around indoors and cough well. You may stop using the incentive spirometer when instructed by your caregiver.  RISKS AND COMPLICATIONS Take your time so you do not get dizzy or light-headed. If you are in pain, you may need to take or ask for pain medication before doing incentive spirometry. It is harder to take a deep breath if you are having pain. AFTER USE Rest and breathe slowly and easily. It can be helpful to keep track of a log of your progress. Your caregiver can provide you with a simple table to help with this. If you are using the spirometer at home, follow these instructions: SEEK MEDICAL CARE IF:  You are having difficultly using the spirometer. You have trouble using the spirometer as often as instructed. Your pain medication is not giving enough relief while using the spirometer. You develop fever of 100.5 F (38.1 C) or higher. SEEK IMMEDIATE MEDICAL CARE IF:  You cough  up bloody sputum that had not been present before. You develop fever of 102 F (38.9 C) or greater. You develop worsening pain at or near the incision site. MAKE SURE YOU:  Understand these instructions. Will watch your condition. Will get help right away  if you are not doing well or get worse. Document Released: 07/28/2006 Document Revised: 06/09/2011 Document Reviewed: 09/28/2006 [facility] Patient Information 2014 [place], [place].   ________________________________________________________________________

## 2020-12-10 NOTE — Progress Notes (Addendum)
COVID swab appointment:  N/A  COVID Vaccine Completed:  Yes x1 Date COVID Vaccine completed: Has received booster: COVID vaccine manufacturer:   [employer]   Date of COVID positive in last 90 days:  No  PCP - Charles A. Tenny Craw, MD Cardiologist - N/A  Chest x-ray - N/A EKG - 12-13-20 Epic Stress Test - N/A ECHO - N/A Cardiac Cath - N/A Pacemaker/ICD device last checked: Spinal Cord Stimulator:  Sleep Study - N/A CPAP -   Fasting Blood Sugar - N/A Checks Blood Sugar _____ times a day  Blood Thinner Instructions:  N/A Aspirin Instructions: Last Dose:  Activity level:  Can go up a flight of stairs and perform activities of daily living without stopping and without symptoms of chest pain or shortness of breath.      Anesthesia review:   N/A  Patient denies shortness of breath, fever, cough and chest pain at PAT appointment   Patient verbalized understanding of instructions that were given to them at the PAT appointment. Patient was also instructed that they will need to review over the PAT instructions again at home before surgery.

## 2020-12-13 ENCOUNTER — Encounter (HOSPITAL_COMMUNITY)
Admission: RE | Admit: 2020-12-13 | Discharge: 2020-12-13 | Disposition: A | Discharge status: Home / Self Care | Payer: Managed Care, Other (non HMO) | Source: Ambulatory Visit | Attending: Orthopedic Surgery | Admitting: Orthopedic Surgery

## 2020-12-13 ENCOUNTER — Other Ambulatory Visit: Payer: Self-pay

## 2020-12-13 ENCOUNTER — Encounter (HOSPITAL_COMMUNITY): Payer: Self-pay

## 2020-12-13 DIAGNOSIS — Z01818 Encounter for other preprocedural examination: Secondary | ICD-10-CM | POA: Insufficient documentation

## 2020-12-13 HISTORY — DX: Personal history of urinary calculi: Z87.442

## 2020-12-13 HISTORY — DX: Hypothyroidism, unspecified: E03.9

## 2020-12-13 LAB — SURGICAL PCR SCREEN
MRSA, PCR: NEGATIVE
Staphylococcus aureus: NEGATIVE

## 2020-12-13 LAB — CBC
HCT: 43.4 % (ref 39.0–52.0)
Hemoglobin: 14.3 g/dL (ref 13.0–17.0)
MCH: 30.2 pg (ref 26.0–34.0)
MCHC: 32.9 g/dL (ref 30.0–36.0)
MCV: 91.6 fL (ref 80.0–100.0)
Platelets: 210 10*3/uL (ref 150–400)
RBC: 4.74 MIL/uL (ref 4.22–5.81)
RDW: 13.2 % (ref 11.5–15.5)
WBC: 9.9 10*3/uL (ref 4.0–10.5)
nRBC: 0 % (ref 0.0–0.2)

## 2020-12-13 LAB — BASIC METABOLIC PANEL
Anion gap: 7 (ref 5–15)
BUN: 24 mg/dL — ABNORMAL HIGH (ref 8–23)
CO2: 24 mmol/L (ref 22–32)
Calcium: 9.4 mg/dL (ref 8.9–10.3)
Chloride: 110 mmol/L (ref 98–111)
Creatinine, Ser: 1.1 mg/dL (ref 0.61–1.24)
GFR, Estimated: >60 mL/min (ref >60)
Glucose, Bld: 106 mg/dL — ABNORMAL HIGH (ref 70–99)
Potassium: 4.2 mmol/L (ref 3.5–5.1)
Sodium: 141 mmol/L (ref 135–145)

## 2020-12-13 NOTE — Progress Notes (Signed)
   12/13/20 0922  OBSTRUCTIVE SLEEP APNEA  Have you ever been diagnosed with sleep apnea through a sleep study? No  Do you snore loudly (loud enough to be heard through closed doors)?  0  Do you often feel tired, fatigued, or sleepy during the daytime (such as falling asleep during driving or talking to someone)? 0  Has anyone observed you stop breathing during your sleep? 0  Do you have, or are you being treated for high blood pressure? 1  BMI more than 35 kg/m2? 1  Age > 50 (1-yes) 1  Neck circumference greater than:Male 16 inches or larger, Male 17inches or larger? 1  Male Gender (Yes=1) 1  Obstructive Sleep Apnea Score 5  Score 5 or greater  Results sent to PCP

## 2020-12-14 NOTE — H&P (Signed)
KNEE ARTHROPLASTY ADMISSION H&P  Patient ID: Joseph Noble MRN: [id number] DOB/AGE: 08/11/1955 65 y.o.  Chief Complaint: right knee pain.  Planned Procedure Date: 12/25/20  Medical Clearance by Dr. Duane Lope  HPI: Joseph Noble is a 65 y.o. male who presents for evaluation of djd right knee. The patient has a history of pain and functional disability in the right knee due to arthritis and has failed non-surgical conservative treatments for greater than 12 weeks to include NSAID's and/or analgesics, corticosteriod injections, viscosupplementation injections, flexibility and strengthening excercises, and activity modification.  Onset of symptoms was gradual, starting 2 years ago with gradually worsening course since that time. The patient noted no past surgery on the right knee.  Patient currently rates pain at 10 out of 10 with activity. Patient has worsening of pain with activity and weight bearing and pain that interferes with activities of daily living.  Patient has evidence of joint space narrowing by imaging studies.  There is no active infection.  Past Medical History:  Diagnosis Date   Arthritis    History of kidney stones    Hyperlipidemia    Hypertension    Hypothyroidism    Wears glasses    Past Surgical History:  Procedure Laterality Date   COLONOSCOPY     HERNIA REPAIR  04/01/2007   umb   INGUINAL HERNIA REPAIR Right 04/21/2013   Procedure: HERNIA REPAIR INGUINAL ADULT;  Surgeon: Liz Malady, MD;  Location: [facility];  Service: General;  Laterality: Right;   INSERTION OF MESH Right 04/21/2013   Procedure: INSERTION OF MESH;  Surgeon: Liz Malady, MD;  Location: [facility];  Service: General;  Laterality: Right;   TONSILLECTOMY     URETEROSCOPY     Allergies  Allergen Reactions   Codeine Itching and Rash   Prior to Admission medications   Medication Sig Start Date End Date Taking? Authorizing Provider  allopurinol (ZYLOPRIM)  300 MG tablet Take 300 mg by mouth daily. 03/21/13  Yes [provider]  amLODipine (NORVASC) 10 MG tablet Take 10 mg by mouth daily. 11/23/20  Yes [provider]  celecoxib (CELEBREX) 200 MG capsule Take 200 mg by mouth 2 (two) times daily. 11/23/20  Yes [provider]  diclofenac Sodium (VOLTAREN) 1 % GEL Apply 1 application topically daily as needed (pain).   Yes [provider]  HYDROcodone-acetaminophen (NORCO) 10-325 MG tablet Take 1 tablet by mouth every 6 (six) hours as needed for severe pain.   Yes [provider]  levothyroxine (SYNTHROID) 75 MCG tablet Take 75 mcg by mouth daily before breakfast.   Yes [provider]  lisinopril (PRINIVIL,ZESTRIL) 40 MG tablet Take 40 mg by mouth daily. 02/19/13  Yes [provider]  metoprolol succinate (TOPROL-XL) 100 MG 24 hr tablet Take 100 mg by mouth daily. 01/20/13  Yes [provider]  ondansetron (ZOFRAN) 8 MG tablet Take 8 mg by mouth every 8 (eight) hours as needed for nausea or vomiting. 11/23/20  Yes [provider]  rosuvastatin (CRESTOR) 5 MG tablet Take 5 mg by mouth daily. 11/20/20  Yes [provider]  Vitamin D, Ergocalciferol, (DRISDOL) 1.25 MG (50000 UNIT) CAPS capsule Take 50,000 Units by mouth every Thursday. 11/10/20  Yes [provider]   Social History   Socioeconomic History   Marital status: Married    Spouse name: Not on file   Number of children: Not on file   Years of education: Not on  file   Highest education level: Not on file  Occupational History   Not on file  Tobacco Use   Smoking status: Never   Smokeless tobacco: Current    Types: Chew  Vaping Use   Vaping Use: Never used  Substance and Sexual Activity   Alcohol use: No    Comment: rare   Drug use: No   Sexual activity: Not on file  Other Topics Concern   Not on file  Social History Narrative   Not on file   Social Determinants of Health   Financial  Resource Strain: Not on file  Food Insecurity: Not on file  Transportation Needs: Not on file  Physical Activity: Not on file  Stress: Not on file  Social Connections: Not on file   Family History  Problem Relation Age of Onset   Cancer Mother        leukemia    ROS: Currently denies lightheadedness, dizziness, Fever, chills, CP, SOB.   No personal history of DVT, PE, MI, or CVA. No loose teeth or dentures All other systems have been reviewed and were otherwise currently negative with the exception of those mentioned in the HPI and as above.  Objective: Vitals: Ht: 6\' 0"  Wt: 275 lbs Temp: 97.8 BP: 133/80 Pulse: 62 O2 98% on room air.   Physical Exam: General: Alert, NAD.  Antalgic Gait  HEENT: EOMI, Good Neck Extension  Pulm: No increased work of breathing.  Clear B/L A/P w/o crackle or wheeze.  CV: RRR, No m/g/r appreciated  GI: soft, NT, ND Neuro: Neuro without gross focal deficit.  Sensation intact distally Skin: No lesions in the area of chief complaint MSK/Surgical Site: right knee w/o redness or effusion.  No M or L JLT. ROM 0-90.  5/5 strength in extension and flexion.  +EHL/FHL.  NVI.  Stable varus and valgus stress.    Imaging Review Plain radiographs demonstrate severe degenerative joint disease of the right knee.   Preoperative templating of the joint replacement has been completed, documented, and submitted to the Operating Room personnel in order to optimize intra-operative equipment management.  Assessment: djd right knee Principal Problem:   Osteoarthritis of right knee   Plan: Plan for Procedure(s): UNICOMPARTMENTAL KNEE  The patient history, physical exam, clinical judgement of the provider and imaging are consistent with end stage degenerative joint disease and unicompartmental joint arthroplasty is deemed medically necessary. The treatment options including medical management, injection therapy, and arthroplasty were discussed at length. The risks and  benefits of Procedure(s): UNICOMPARTMENTAL KNEE were presented and reviewed.  The risks of nonoperative treatment, versus surgical intervention including but not limited to continued pain, aseptic loosening, stiffness, dislocation/subluxation, infection, bleeding, nerve injury, blood clots, cardiopulmonary complications, morbidity, mortality, among others were discussed. The patient verbalizes understanding and wishes to proceed with the plan.  Patient is being admitted for inpatient treatment for surgery, pain control, PT, prophylactic antibiotics, VTE prophylaxis, progressive ambulation, ADL's and discharge planning.   Dental prophylaxis discussed and recommended for 2 years postoperatively.  The patient does meet the criteria for TXA which will be used perioperatively.   ASA 325 mg will be used postoperatively for DVT prophylaxis in addition to SCDs, and early ambulation. The patient is planning to be discharged home with OPPT in care of his wife   Patient's anticipated LOS is less than 2 midnights, meeting these requirements: - Younger than 53 - Lives within 1 hour of care - Has a competent adult at home to recover with  post-op recover - NO history of  - Chronic pain requiring opiods  - Diabetes  - Coronary Artery Disease  - Heart failure  - Heart attack  - Stroke  - DVT/VTE  - Cardiac arrhythmia  - Respiratory Failure/COPD  - Renal failure  - Anemia  - Advanced Liver disease      Armida Sans, PA-C 12/14/2020 1:56 PM

## 2020-12-24 NOTE — Anesthesia Preprocedure Evaluation (Addendum)
Anesthesia Evaluation  Patient identified by MRN, date of birth, ID band Patient awake    Reviewed: Allergy & Precautions, NPO status , Patient's Chart, lab work & pertinent test results, reviewed documented beta blocker date and time   History of Anesthesia Complications Negative for: history of anesthetic complications  Airway Mallampati: II  TM Distance: >3 FB Neck ROM: Full    Dental  (+) Dental Advisory Given   Pulmonary neg pulmonary ROS,    breath sounds clear to auscultation       Cardiovascular hypertension, Pt. on medications and Pt. on home beta blockers (-) angina Rhythm:Regular Rate:Normal     Neuro/Psych negative neurological ROS     GI/Hepatic negative GI ROS, Neg liver ROS,   Endo/Other  Hypothyroidism Morbid obesity  Renal/GU negative Renal ROS     Musculoskeletal   Abdominal (+) + obese,   Peds  Hematology negative hematology ROS (+)   Anesthesia Other Findings   Reproductive/Obstetrics                            Anesthesia Physical Anesthesia Plan  ASA: 3  Anesthesia Plan: Spinal   Post-op Pain Management:  Regional for Post-op pain   Induction:   PONV Risk Score and Plan: 1 and Ondansetron  Airway Management Planned: Natural Airway and Simple Face Mask  Additional Equipment: None  Intra-op Plan:   Post-operative Plan: Extubation in OR  Informed Consent: I have reviewed the patients History and Physical, chart, labs and discussed the procedure including the risks, benefits and alternatives for the proposed anesthesia with the patient or authorized representative who has indicated his/her understanding and acceptance.     Dental advisory given  Plan Discussed with: CRNA and Surgeon  Anesthesia Plan Comments: (Plan routine monitors, SAB with adductor canal block for post op analgesia)      Anesthesia Quick Evaluation

## 2020-12-25 ENCOUNTER — Ambulatory Visit (HOSPITAL_COMMUNITY): Payer: Managed Care, Other (non HMO) | Admitting: Anesthesiology

## 2020-12-25 ENCOUNTER — Other Ambulatory Visit: Payer: Self-pay

## 2020-12-25 ENCOUNTER — Encounter (HOSPITAL_COMMUNITY)
Admission: RE | Disposition: A | Discharge status: Home / Self Care | Payer: Self-pay | Source: Home / Self Care | Attending: Orthopedic Surgery

## 2020-12-25 ENCOUNTER — Ambulatory Visit (HOSPITAL_COMMUNITY)
Admission: RE | Admit: 2020-12-25 | Discharge: 2020-12-26 | Disposition: A | Discharge status: Home / Self Care | Payer: Managed Care, Other (non HMO) | Attending: Orthopedic Surgery | Admitting: Orthopedic Surgery

## 2020-12-25 ENCOUNTER — Ambulatory Visit (HOSPITAL_COMMUNITY): Payer: Managed Care, Other (non HMO)

## 2020-12-25 ENCOUNTER — Encounter (HOSPITAL_COMMUNITY): Payer: Self-pay | Admitting: Orthopedic Surgery

## 2020-12-25 DIAGNOSIS — Z79890 Hormone replacement therapy (postmenopausal): Secondary | ICD-10-CM | POA: Insufficient documentation

## 2020-12-25 DIAGNOSIS — Z96651 Presence of right artificial knee joint: Secondary | ICD-10-CM

## 2020-12-25 DIAGNOSIS — Z885 Allergy status to narcotic agent status: Secondary | ICD-10-CM | POA: Diagnosis not present

## 2020-12-25 DIAGNOSIS — M1711 Unilateral primary osteoarthritis, right knee: Secondary | ICD-10-CM | POA: Diagnosis not present

## 2020-12-25 DIAGNOSIS — Z79899 Other long term (current) drug therapy: Secondary | ICD-10-CM | POA: Diagnosis not present

## 2020-12-25 DIAGNOSIS — Z791 Long term (current) use of non-steroidal anti-inflammatories (NSAID): Secondary | ICD-10-CM | POA: Diagnosis not present

## 2020-12-25 HISTORY — PX: PARTIAL KNEE ARTHROPLASTY: SHX2174

## 2020-12-25 SURGERY — ARTHROPLASTY, KNEE, UNICOMPARTMENTAL
Anesthesia: General | Site: Knee | Laterality: Right

## 2020-12-25 MED ORDER — OXYCODONE HCL 5 MG PO TABS
5.0000 mg | ORAL_TABLET | ORAL | Status: DC | PRN
  Administered 2020-12-25 – 2020-12-26 (×2): 10 mg via ORAL
  Filled 2020-12-25 (×3): qty 2

## 2020-12-25 MED ORDER — ONDANSETRON HCL 4 MG/2ML IJ SOLN
INTRAMUSCULAR | Status: AC
  Filled 2020-12-25: qty 2

## 2020-12-25 MED ORDER — BUPIVACAINE HCL 0.25 % IJ SOLN
INTRAMUSCULAR | Status: DC | PRN
  Administered 2020-12-25: 30 mL

## 2020-12-25 MED ORDER — FLEET ENEMA 7-19 GM/118ML RE ENEM: 1.0000 | ENEMA | Freq: Once | RECTAL | Status: DC | PRN

## 2020-12-25 MED ORDER — HYDROMORPHONE HCL 1 MG/ML IJ SOLN
INTRAMUSCULAR | Status: DC | PRN
  Administered 2020-12-25 (×3): .4 mg via INTRAVENOUS

## 2020-12-25 MED ORDER — METOCLOPRAMIDE HCL 5 MG/ML IJ SOLN
5.0000 mg | Freq: Three times a day (TID) | INTRAMUSCULAR | Status: DC | PRN
  Administered 2020-12-25: 10 mg via INTRAVENOUS
  Filled 2020-12-25 (×2): qty 2

## 2020-12-25 MED ORDER — METOPROLOL SUCCINATE ER 50 MG PO TB24
100.0000 mg | ORAL_TABLET | Freq: Every day | ORAL | Status: DC
  Administered 2020-12-26: 100 mg via ORAL
  Filled 2020-12-25: qty 2

## 2020-12-25 MED ORDER — HYDROMORPHONE HCL 1 MG/ML IJ SOLN
0.2500 mg | INTRAMUSCULAR | Status: DC | PRN
  Administered 2020-12-25 (×3): 0.5 mg via INTRAVENOUS

## 2020-12-25 MED ORDER — MENTHOL 3 MG MT LOZG: 1.0000 | LOZENGE | OROMUCOSAL | Status: DC | PRN

## 2020-12-25 MED ORDER — PHENYLEPHRINE HCL (PRESSORS) 10 MG/ML IV SOLN
INTRAVENOUS | Status: AC
  Filled 2020-12-25: qty 2

## 2020-12-25 MED ORDER — OXYCODONE HCL 5 MG PO TABS: 5.0000 mg | ORAL_TABLET | Freq: Once | ORAL | Status: DC | PRN

## 2020-12-25 MED ORDER — PHENOL 1.4 % MT LIQD: 1.0000 | OROMUCOSAL | Status: DC | PRN

## 2020-12-25 MED ORDER — FENTANYL CITRATE (PF) 100 MCG/2ML IJ SOLN
INTRAMUSCULAR | Status: AC
  Filled 2020-12-25: qty 2

## 2020-12-25 MED ORDER — MIDAZOLAM HCL 5 MG/5ML IJ SOLN
INTRAMUSCULAR | Status: DC | PRN
Start: 2020-12-25 — End: 2020-12-25
  Administered 2020-12-25: 2 mg via INTRAVENOUS

## 2020-12-25 MED ORDER — HYDROMORPHONE HCL 2 MG/ML IJ SOLN
INTRAMUSCULAR | Status: AC
  Filled 2020-12-25: qty 1

## 2020-12-25 MED ORDER — TRANEXAMIC ACID-NACL 1000-0.7 MG/100ML-% IV SOLN
1000.0000 mg | INTRAVENOUS | Status: AC
  Administered 2020-12-25: 1000 mg via INTRAVENOUS
  Filled 2020-12-25: qty 100

## 2020-12-25 MED ORDER — POVIDONE-IODINE 10 % EX SWAB
2.0000 "application " | Freq: Once | CUTANEOUS | Status: AC
  Administered 2020-12-25: 2 via TOPICAL

## 2020-12-25 MED ORDER — HYDROMORPHONE HCL 1 MG/ML IJ SOLN: 0.5000 mg | INTRAMUSCULAR | Status: DC | PRN

## 2020-12-25 MED ORDER — ACETAMINOPHEN 325 MG PO TABS: 325.0000 mg | ORAL_TABLET | Freq: Four times a day (QID) | ORAL | Status: DC | PRN

## 2020-12-25 MED ORDER — KETAMINE HCL 10 MG/ML IJ SOLN
INTRAMUSCULAR | Status: DC | PRN
  Administered 2020-12-25: 60 mg via INTRAVENOUS

## 2020-12-25 MED ORDER — SUGAMMADEX SODIUM 500 MG/5ML IV SOLN
INTRAVENOUS | Status: DC | PRN
  Administered 2020-12-25: 250 mg via INTRAVENOUS

## 2020-12-25 MED ORDER — FENTANYL CITRATE (PF) 250 MCG/5ML IJ SOLN
INTRAMUSCULAR | Status: AC
  Filled 2020-12-25: qty 5

## 2020-12-25 MED ORDER — CEFAZOLIN SODIUM-DEXTROSE 2-4 GM/100ML-% IV SOLN
2.0000 g | Freq: Four times a day (QID) | INTRAVENOUS | Status: AC
Start: 2020-12-25 — End: 2020-12-25
  Administered 2020-12-25 (×2): 2 g via INTRAVENOUS
  Filled 2020-12-25 (×2): qty 100

## 2020-12-25 MED ORDER — LACTATED RINGERS IV SOLN: INTRAVENOUS | Status: DC

## 2020-12-25 MED ORDER — CEFAZOLIN IN SODIUM CHLORIDE 3-0.9 GM/100ML-% IV SOLN
3.0000 g | INTRAVENOUS | Status: AC
  Administered 2020-12-25: 3 g via INTRAVENOUS
  Filled 2020-12-25: qty 100

## 2020-12-25 MED ORDER — EPHEDRINE 5 MG/ML INJ
INTRAVENOUS | Status: AC
  Filled 2020-12-25: qty 5

## 2020-12-25 MED ORDER — GLYCOPYRROLATE 0.2 MG/ML IJ SOLN
INTRAMUSCULAR | Status: AC
  Filled 2020-12-25: qty 1

## 2020-12-25 MED ORDER — ACETAMINOPHEN 500 MG PO TABS
1000.0000 mg | ORAL_TABLET | Freq: Once | ORAL | Status: AC
  Administered 2020-12-25: 1000 mg via ORAL
  Filled 2020-12-25: qty 2

## 2020-12-25 MED ORDER — ORAL CARE MOUTH RINSE: 15.0000 mL | Freq: Once | OROMUCOSAL | Status: AC

## 2020-12-25 MED ORDER — POVIDONE-IODINE 10 % EX SWAB: 2.0000 "application " | Freq: Once | CUTANEOUS | Status: DC

## 2020-12-25 MED ORDER — KETOROLAC TROMETHAMINE 30 MG/ML IJ SOLN
INTRAMUSCULAR | Status: AC
  Filled 2020-12-25: qty 1

## 2020-12-25 MED ORDER — ROPIVACAINE HCL 5 MG/ML IJ SOLN
INTRAMUSCULAR | Status: DC | PRN
  Administered 2020-12-25: 20 mL via EPIDURAL

## 2020-12-25 MED ORDER — ONDANSETRON HCL 4 MG PO TABS
8.0000 mg | ORAL_TABLET | Freq: Three times a day (TID) | ORAL | Status: DC | PRN
  Administered 2020-12-26: 8 mg via ORAL
  Filled 2020-12-25: qty 2

## 2020-12-25 MED ORDER — EPHEDRINE SULFATE-NACL 50-0.9 MG/10ML-% IV SOSY
PREFILLED_SYRINGE | INTRAVENOUS | Status: DC | PRN
  Administered 2020-12-25: 10 mg via INTRAVENOUS

## 2020-12-25 MED ORDER — METHOCARBAMOL 500 MG PO TABS
500.0000 mg | ORAL_TABLET | Freq: Four times a day (QID) | ORAL | Status: DC | PRN
  Administered 2020-12-25: 500 mg via ORAL
  Filled 2020-12-25: qty 1

## 2020-12-25 MED ORDER — METOCLOPRAMIDE HCL 5 MG PO TABS: 5.0000 mg | ORAL_TABLET | Freq: Three times a day (TID) | ORAL | Status: DC | PRN

## 2020-12-25 MED ORDER — KETOROLAC TROMETHAMINE 15 MG/ML IJ SOLN
7.5000 mg | Freq: Four times a day (QID) | INTRAMUSCULAR | Status: AC
Start: 2020-12-25 — End: 2020-12-26
  Administered 2020-12-25 – 2020-12-26 (×4): 7.5 mg via INTRAVENOUS
  Filled 2020-12-25 (×4): qty 1

## 2020-12-25 MED ORDER — OXYCODONE HCL 5 MG/5ML PO SOLN: 5.0000 mg | Freq: Once | ORAL | Status: DC | PRN

## 2020-12-25 MED ORDER — KETAMINE HCL 10 MG/ML IJ SOLN
INTRAMUSCULAR | Status: AC
  Filled 2020-12-25: qty 1

## 2020-12-25 MED ORDER — PROPOFOL 500 MG/50ML IV EMUL
INTRAVENOUS | Status: AC
  Filled 2020-12-25: qty 50

## 2020-12-25 MED ORDER — POLYETHYLENE GLYCOL 3350 17 G PO PACK: 17.0000 g | PACK | Freq: Every day | ORAL | Status: DC | PRN

## 2020-12-25 MED ORDER — ROCURONIUM BROMIDE 100 MG/10ML IV SOLN
INTRAVENOUS | Status: DC | PRN
  Administered 2020-12-25: 70 mg via INTRAVENOUS

## 2020-12-25 MED ORDER — LISINOPRIL 20 MG PO TABS
40.0000 mg | ORAL_TABLET | Freq: Every day | ORAL | Status: DC
  Administered 2020-12-26: 40 mg via ORAL
  Filled 2020-12-25: qty 2

## 2020-12-25 MED ORDER — METHOCARBAMOL 500 MG IVPB - SIMPLE MED
500.0000 mg | Freq: Four times a day (QID) | INTRAVENOUS | Status: DC | PRN
  Administered 2020-12-25: 500 mg via INTRAVENOUS
  Filled 2020-12-25: qty 50

## 2020-12-25 MED ORDER — KETOROLAC TROMETHAMINE 30 MG/ML IJ SOLN
INTRAMUSCULAR | Status: DC | PRN
  Administered 2020-12-25: 30 mg

## 2020-12-25 MED ORDER — HYDROMORPHONE HCL 1 MG/ML IJ SOLN
INTRAMUSCULAR | Status: AC
  Filled 2020-12-25: qty 2

## 2020-12-25 MED ORDER — CHLORHEXIDINE GLUCONATE 0.12 % MT SOLN
15.0000 mL | Freq: Once | OROMUCOSAL | Status: AC
  Administered 2020-12-25: 15 mL via OROMUCOSAL

## 2020-12-25 MED ORDER — LEVOTHYROXINE SODIUM 75 MCG PO TABS
75.0000 ug | ORAL_TABLET | Freq: Every day | ORAL | Status: DC
  Administered 2020-12-26: 75 ug via ORAL
  Filled 2020-12-25: qty 1

## 2020-12-25 MED ORDER — DOCUSATE SODIUM 100 MG PO CAPS
100.0000 mg | ORAL_CAPSULE | Freq: Two times a day (BID) | ORAL | Status: DC
  Administered 2020-12-25 – 2020-12-26 (×2): 100 mg via ORAL
  Filled 2020-12-25 (×2): qty 1

## 2020-12-25 MED ORDER — OXYCODONE HCL 5 MG PO TABS
10.0000 mg | ORAL_TABLET | ORAL | Status: DC | PRN
  Administered 2020-12-26: 10 mg via ORAL

## 2020-12-25 MED ORDER — MEPERIDINE HCL 50 MG/ML IJ SOLN: 6.2500 mg | INTRAMUSCULAR | Status: DC | PRN

## 2020-12-25 MED ORDER — ACETAMINOPHEN 500 MG PO TABS: 1000.0000 mg | ORAL_TABLET | Freq: Once | ORAL | Status: DC

## 2020-12-25 MED ORDER — PROPOFOL 10 MG/ML IV BOLUS
INTRAVENOUS | Status: DC | PRN
Start: 2020-12-25 — End: 2020-12-25
  Administered 2020-12-25: 30 mg via INTRAVENOUS
  Administered 2020-12-25: 20 mg via INTRAVENOUS
  Administered 2020-12-25: 30 mg via INTRAVENOUS
  Administered 2020-12-25 (×2): 20 mg via INTRAVENOUS
  Administered 2020-12-25: 180 mg via INTRAVENOUS

## 2020-12-25 MED ORDER — GLYCOPYRROLATE 0.2 MG/ML IJ SOLN
INTRAMUSCULAR | Status: DC | PRN
  Administered 2020-12-25: .2 mg via INTRAVENOUS

## 2020-12-25 MED ORDER — DEXAMETHASONE SODIUM PHOSPHATE 10 MG/ML IJ SOLN
INTRAMUSCULAR | Status: AC
  Filled 2020-12-25: qty 1

## 2020-12-25 MED ORDER — PROMETHAZINE HCL 25 MG/ML IJ SOLN
INTRAMUSCULAR | Status: AC
  Filled 2020-12-25: qty 1

## 2020-12-25 MED ORDER — SODIUM CHLORIDE 0.9 % IR SOLN
Status: DC | PRN
  Administered 2020-12-25: 1000 mL

## 2020-12-25 MED ORDER — TRANEXAMIC ACID-NACL 1000-0.7 MG/100ML-% IV SOLN
1000.0000 mg | Freq: Once | INTRAVENOUS | Status: AC
  Administered 2020-12-25: 1000 mg via INTRAVENOUS
  Filled 2020-12-25: qty 100

## 2020-12-25 MED ORDER — BISACODYL 10 MG RE SUPP: 10.0000 mg | Freq: Every day | RECTAL | Status: DC | PRN

## 2020-12-25 MED ORDER — 0.9 % SODIUM CHLORIDE (POUR BTL) OPTIME
TOPICAL | Status: DC | PRN
  Administered 2020-12-25: 1000 mL

## 2020-12-25 MED ORDER — ALLOPURINOL 300 MG PO TABS
300.0000 mg | ORAL_TABLET | Freq: Every day | ORAL | Status: DC
  Administered 2020-12-26: 300 mg via ORAL
  Filled 2020-12-25: qty 1

## 2020-12-25 MED ORDER — PROMETHAZINE HCL 25 MG/ML IJ SOLN
6.2500 mg | INTRAMUSCULAR | Status: AC | PRN
  Administered 2020-12-25: 12.5 mg via INTRAVENOUS
  Administered 2020-12-25: 6.25 mg via INTRAVENOUS

## 2020-12-25 MED ORDER — ACETAMINOPHEN 500 MG PO TABS
1000.0000 mg | ORAL_TABLET | Freq: Four times a day (QID) | ORAL | Status: DC
  Administered 2020-12-25 – 2020-12-26 (×3): 1000 mg via ORAL
  Filled 2020-12-25 (×4): qty 2

## 2020-12-25 MED ORDER — DEXAMETHASONE SODIUM PHOSPHATE 10 MG/ML IJ SOLN
INTRAMUSCULAR | Status: DC | PRN
  Administered 2020-12-25: 10 mg via INTRAVENOUS

## 2020-12-25 MED ORDER — METHOCARBAMOL 500 MG IVPB - SIMPLE MED
INTRAVENOUS | Status: AC
  Filled 2020-12-25: qty 50

## 2020-12-25 MED ORDER — ASPIRIN EC 325 MG PO TBEC
325.0000 mg | DELAYED_RELEASE_TABLET | Freq: Every day | ORAL | Status: DC
  Administered 2020-12-26: 325 mg via ORAL
  Filled 2020-12-25: qty 1

## 2020-12-25 MED ORDER — ALUM & MAG HYDROXIDE-SIMETH 200-200-20 MG/5ML PO SUSP
30.0000 mL | ORAL | Status: DC | PRN
  Administered 2020-12-26: 30 mL via ORAL
  Filled 2020-12-25: qty 30

## 2020-12-25 MED ORDER — DEXAMETHASONE SODIUM PHOSPHATE 10 MG/ML IJ SOLN
10.0000 mg | Freq: Once | INTRAMUSCULAR | Status: AC
  Administered 2020-12-26: 10 mg via INTRAVENOUS
  Filled 2020-12-25: qty 1

## 2020-12-25 MED ORDER — MIDAZOLAM HCL 2 MG/2ML IJ SOLN
INTRAMUSCULAR | Status: AC
  Filled 2020-12-25: qty 2

## 2020-12-25 MED ORDER — STERILE WATER FOR IRRIGATION IR SOLN
Status: DC | PRN
  Administered 2020-12-25: 2000 mL

## 2020-12-25 MED ORDER — SODIUM CHLORIDE 0.9 % IV SOLN
8.0000 mg | Freq: Three times a day (TID) | INTRAVENOUS | Status: DC | PRN
  Filled 2020-12-25: qty 4

## 2020-12-25 MED ORDER — DICLOFENAC SODIUM 1 % EX GEL: 1.0000 "application " | Freq: Every day | CUTANEOUS | Status: DC | PRN

## 2020-12-25 MED ORDER — POTASSIUM CHLORIDE IN NACL 20-0.45 MEQ/L-% IV SOLN
INTRAVENOUS | Status: DC
  Filled 2020-12-25 (×2): qty 1000

## 2020-12-25 MED ORDER — ZOLPIDEM TARTRATE 5 MG PO TABS: 5.0000 mg | ORAL_TABLET | Freq: Every evening | ORAL | Status: DC | PRN

## 2020-12-25 MED ORDER — MIDAZOLAM HCL 2 MG/2ML IJ SOLN: 0.5000 mg | Freq: Once | INTRAMUSCULAR | Status: DC | PRN

## 2020-12-25 MED ORDER — ONDANSETRON HCL 4 MG/2ML IJ SOLN
INTRAMUSCULAR | Status: DC | PRN
  Administered 2020-12-25: 4 mg via INTRAVENOUS

## 2020-12-25 MED ORDER — PHENYLEPHRINE HCL-NACL 20-0.9 MG/250ML-% IV SOLN
INTRAVENOUS | Status: DC | PRN
  Administered 2020-12-25: 50 ug/min via INTRAVENOUS

## 2020-12-25 MED ORDER — FENTANYL CITRATE (PF) 100 MCG/2ML IJ SOLN
INTRAMUSCULAR | Status: DC | PRN
  Administered 2020-12-25 (×4): 25 ug via INTRAVENOUS
  Administered 2020-12-25: 150 ug via INTRAVENOUS
  Administered 2020-12-25: 100 ug via INTRAVENOUS

## 2020-12-25 MED ORDER — PROPOFOL 10 MG/ML IV BOLUS
INTRAVENOUS | Status: AC
  Filled 2020-12-25: qty 20

## 2020-12-25 MED ORDER — ROSUVASTATIN CALCIUM 5 MG PO TABS
5.0000 mg | ORAL_TABLET | Freq: Every day | ORAL | Status: DC
  Administered 2020-12-26: 5 mg via ORAL
  Filled 2020-12-25: qty 1

## 2020-12-25 MED ORDER — DIPHENHYDRAMINE HCL 12.5 MG/5ML PO ELIX: 12.5000 mg | ORAL_SOLUTION | ORAL | Status: DC | PRN

## 2020-12-25 MED ORDER — BUPIVACAINE HCL (PF) 0.25 % IJ SOLN
INTRAMUSCULAR | Status: AC
  Filled 2020-12-25: qty 30

## 2020-12-25 MED ORDER — AMLODIPINE BESYLATE 10 MG PO TABS
10.0000 mg | ORAL_TABLET | Freq: Every day | ORAL | Status: DC
  Administered 2020-12-26: 10 mg via ORAL
  Filled 2020-12-25: qty 1

## 2020-12-25 SURGICAL SUPPLY — 76 items
ATTUNE MED DOME PAT 38 KNEE (Knees) ×1 IMPLANT
ATTUNE PS FEM RT SZ 8 CEM KNEE (Femur) ×1 IMPLANT
BAG COUNTER SPONGE SURGICOUNT (BAG) IMPLANT
BAG SPEC THK2 15X12 ZIP CLS (MISCELLANEOUS) ×1
BAG SPNG CNTER NS LX DISP (BAG)
BAG ZIPLOCK 12X15 (MISCELLANEOUS) ×2 IMPLANT
BANDAGE ESMARK 6X9 LF (GAUZE/BANDAGES/DRESSINGS) ×1 IMPLANT
BASE TIBIAL CEM ATTUNE SZ 7 (Knees) ×2 IMPLANT
BASEPLATE TIB CEM ATTUNE SZ7 (Knees) IMPLANT
BIT DRILL QUICK REL 1/8 2PK SL (DRILL) IMPLANT
BLADE SAG 18X100X1.27 (BLADE) ×1 IMPLANT
BLADE SURG 15 STRL LF DISP TIS (BLADE) ×1 IMPLANT
BLADE SURG 15 STRL SS (BLADE) ×2
BNDG CMPR 9X6 STRL LF SNTH (GAUZE/BANDAGES/DRESSINGS) ×1
BNDG CMPR MED 15X6 ELC VLCR LF (GAUZE/BANDAGES/DRESSINGS) ×1
BNDG ELASTIC 6X15 VLCR STRL LF (GAUZE/BANDAGES/DRESSINGS) ×2 IMPLANT
BNDG ESMARK 6X9 LF (GAUZE/BANDAGES/DRESSINGS) ×2
BOWL SMART MIX CTS (DISPOSABLE) ×2 IMPLANT
BSPLAT TIB 7 CMNT FX BRNG STRL (Knees) ×1 IMPLANT
CEMENT BONE R 1X40 (Cement) ×3 IMPLANT
CLSR STERI-STRIP ANTIMIC 1/2X4 (GAUZE/BANDAGES/DRESSINGS) ×3 IMPLANT
COVER SURGICAL LIGHT HANDLE (MISCELLANEOUS) ×2 IMPLANT
CUFF TOURN SGL QUICK 34 (TOURNIQUET CUFF) ×2
CUFF TRNQT CYL 34X4.125X (TOURNIQUET CUFF) ×1 IMPLANT
DECANTER SPIKE VIAL GLASS SM (MISCELLANEOUS) IMPLANT
DRAPE EXTREMITY T 121X128X90 (DISPOSABLE) ×2 IMPLANT
DRAPE POUCH INSTRU U-SHP 10X18 (DRAPES) ×2 IMPLANT
DRAPE SHEET LG 3/4 BI-LAMINATE (DRAPES) ×2 IMPLANT
DRAPE U-SHAPE 47X51 STRL (DRAPES) ×2 IMPLANT
DRESSING AQUACEL AG SP 3.5X10 (GAUZE/BANDAGES/DRESSINGS) IMPLANT
DRILL QUICK RELEASE 1/8 INCH (DRILL) ×2
DRSG AQUACEL AG SP 3.5X10 (GAUZE/BANDAGES/DRESSINGS) ×2
DRSG MEPILEX BORDER 4X8 (GAUZE/BANDAGES/DRESSINGS) ×2 IMPLANT
DRSG PAD ABDOMINAL 8X10 ST (GAUZE/BANDAGES/DRESSINGS) ×3 IMPLANT
DURAPREP 26ML APPLICATOR (WOUND CARE) ×4 IMPLANT
ELECT REM PT RETURN 15FT ADLT (MISCELLANEOUS) ×2 IMPLANT
FACESHIELD WRAPAROUND (MASK) ×2 IMPLANT
FACESHIELD WRAPAROUND OR TEAM (MASK) ×1 IMPLANT
GLOVE SRG 8 PF TXTR STRL LF DI (GLOVE) ×1 IMPLANT
GLOVE SURG ENC MOIS LTX SZ7 (GLOVE) ×2 IMPLANT
GLOVE SURG POLYISO LF SZ7.5 (GLOVE) ×2 IMPLANT
GLOVE SURG UNDER POLY LF SZ7 (GLOVE) ×2 IMPLANT
GLOVE SURG UNDER POLY LF SZ8 (GLOVE) ×2
GOWN STRL REUS W/TWL LRG LVL3 (GOWN DISPOSABLE) ×4 IMPLANT
HANDPIECE INTERPULSE COAX TIP (DISPOSABLE) ×2
HOLDER FOLEY CATH W/STRAP (MISCELLANEOUS) IMPLANT
HOOD PEEL AWAY FLYTE STAYCOOL (MISCELLANEOUS) ×4 IMPLANT
IMMOBILIZER KNEE 20 (SOFTGOODS)
IMMOBILIZER KNEE 20 THIGH 36 (SOFTGOODS) IMPLANT
IMMOBILIZER KNEE 22 UNIV (SOFTGOODS) ×1 IMPLANT
INSERT TIBIA FIXED BEARING SZ8 (Insert) ×1 IMPLANT
KIT BASIN OR (CUSTOM PROCEDURE TRAY) ×2 IMPLANT
KIT TURNOVER KIT A (KITS) ×2 IMPLANT
NDL SAFETY ECLIPSE 18X1.5 (NEEDLE) ×1 IMPLANT
NEEDLE HYPO 18GX1.5 SHARP (NEEDLE) ×2
NS IRRIG 1000ML POUR BTL (IV SOLUTION) ×2 IMPLANT
PACK BLADE SAW RECIP 70 3 PT (BLADE) ×1 IMPLANT
PACK ICE MAXI GEL EZY WRAP (MISCELLANEOUS) ×2 IMPLANT
PACK TOTAL JOINT (CUSTOM PROCEDURE TRAY) ×2 IMPLANT
PENCIL SMOKE EVACUATOR (MISCELLANEOUS) IMPLANT
PIN DRILL FIX HALF THREAD (BIT) ×1 IMPLANT
PIN STEINMAN FIXATION KNEE (PIN) ×1 IMPLANT
PROTECTOR NERVE ULNAR (MISCELLANEOUS) ×2 IMPLANT
SET HNDPC FAN SPRY TIP SCT (DISPOSABLE) ×1 IMPLANT
SUCTION FRAZIER HANDLE 12FR (TUBING) ×2
SUCTION TUBE FRAZIER 12FR DISP (TUBING) ×1 IMPLANT
SUT VIC AB 1 CT1 36 (SUTURE) ×3 IMPLANT
SUT VIC AB 2-0 CT1 27 (SUTURE) ×2
SUT VIC AB 2-0 CT1 TAPERPNT 27 (SUTURE) ×1 IMPLANT
SUT VIC AB 3-0 SH 8-18 (SUTURE) ×2 IMPLANT
SYR 30ML LL (SYRINGE) ×2 IMPLANT
SYR 3ML LL SCALE MARK (SYRINGE) ×2 IMPLANT
TOWEL OR 17X26 10 PK STRL BLUE (TOWEL DISPOSABLE) ×2 IMPLANT
TOWEL OR NON WOVEN STRL DISP B (DISPOSABLE) ×2 IMPLANT
TRAY FOLEY MTR SLVR 16FR STAT (SET/KITS/TRAYS/PACK) ×2 IMPLANT
WATER STERILE IRR 1000ML POUR (IV SOLUTION) ×2 IMPLANT

## 2020-12-25 NOTE — Anesthesia Procedure Notes (Signed)
Anesthesia Regional Block: Adductor canal block   Pre-Anesthetic Checklist: , timeout performed,  Correct Patient, Correct Site, Correct Laterality,  Correct Procedure, Correct Position, site marked,  Risks and benefits discussed,  Surgical consent,  Pre-op evaluation,  At surgeon's request and post-op pain management  Laterality: Right and Lower  Prep: chloraprep       Needles:  Injection technique: Single-shot  Needle Type: Echogenic Needle     Needle Length: 9cm  Needle Gauge: 21     Additional Needles:   Procedures:,,,, ultrasound used (permanent image in chart),,    Narrative:  Start time: 12/25/2020 7:10 AM End time: 12/25/2020 7:16 AM Injection made incrementally with aspirations every 5 mL.  Performed by: Personally  Anesthesiologist: Jairo Ben, MD  Additional Notes: Pt identified in Holding room.  Monitors applied. Working IV access confirmed. Sterile prep R thigh.  #21ga ECHOgenic Arrow block needle into adductor canal with US guidance.  20cc 0.5% Ropivacaine injected incrementally after negative test dose.  Patient asymptomatic, VSS, no heme aspirated, tolerated well.   Sandford Craze, MD

## 2020-12-25 NOTE — Anesthesia Procedure Notes (Signed)
Spinal  Patient location during procedure: OR Reason for block: surgical anesthesia Staffing Performed: anesthesiologist  Anesthesiologist: Jairo Ben, MD Preanesthetic Checklist Completed: patient identified, IV checked, site marked, risks and benefits discussed, surgical consent, monitors and equipment checked, pre-op evaluation and timeout performed Spinal Block Patient position: sitting Prep: DuraPrep and site prepped and draped Patient monitoring: blood pressure, continuous pulse ox, cardiac monitor and heart rate Approach: midline Location: L3-4 Injection technique: single-shot Needle Needle type: Pencan, Introducer and Quincke  Needle gauge: 22 G Needle length: 12.7 cm Assessment Events: failed spinal Additional Notes Pt identified in Operating room.  Monitors applied. Working IV access confirmed. Sterile prep, drape lumbar spine.  1% lido local L 3,4. Unable to reach CSF with #24ga Pencan, #22ga Quincke, 12.7cm.  VSS, no heme aspirated, tolerated well, but unsuccessful. Will proceed to Scarlette Calico, MD

## 2020-12-25 NOTE — Anesthesia Procedure Notes (Signed)
Procedure Name: Intubation Date/Time: 12/25/2020 8:12 AM Performed by: Florene Route, CRNA Pre-anesthesia Checklist: Patient identified, Emergency Drugs available, Suction available and Patient being monitored Patient Re-evaluated:Patient Re-evaluated prior to induction Oxygen Delivery Method: Circle system utilized Preoxygenation: Pre-oxygenation with 100% oxygen Induction Type: IV induction Ventilation: Mask ventilation without difficulty Laryngoscope Size: Miller and 3 Grade View: Grade II Tube type: Oral Tube size: 8.0 mm Number of attempts: 2 Airway Equipment and Method: Stylet and Oral airway Placement Confirmation: ETT inserted through vocal cords under direct vision, positive ETCO2 and breath sounds checked- equal and bilateral Secured at: 23 cm Tube secured with: Tape Dental Injury: Teeth and Oropharynx as per pre-operative assessment

## 2020-12-25 NOTE — Anesthesia Postprocedure Evaluation (Signed)
Anesthesia Post Note  Patient: PARTHIV MUCCI  Procedure(s) Performed: LEFT TOTAL KNEE ARTHROPLASTY (Right: Knee)     Patient location during evaluation: PACU Anesthesia Type: General Level of consciousness: sedated, patient cooperative and oriented Pain management: pain level controlled Vital Signs Assessment: post-procedure vital signs reviewed and stable Respiratory status: spontaneous breathing, nonlabored ventilation and respiratory function stable Cardiovascular status: blood pressure returned to baseline and stable Postop Assessment: no apparent nausea or vomiting Anesthetic complications: no   No notable events documented.  Last Vitals:  Vitals:   12/25/20 1230 12/25/20 1245  BP: (!) 150/84 (!) 159/87  Pulse: 65 62  Resp: 15 14  Temp:    SpO2: 100% 98%    Last Pain:  Vitals:   12/25/20 1245  TempSrc:   PainSc: Asleep                 Lillan Mccreadie,E. Gabbriella Presswood

## 2020-12-25 NOTE — Op Note (Signed)
DATE OF SURGERY:  12/25/2020 TIME: 10:27 AM  PATIENT NAME:  Joseph Noble   AGE: 65 y.o.    PRE-OPERATIVE DIAGNOSIS: Right knee primary localized osteoarthritis  POST-OPERATIVE DIAGNOSIS:  Same  PROCEDURE: RIGHT total Knee Arthroplasty  SURGEON:  Eulas Post, MD   ASSISTANT:  Janine Ores, PA-C, present and scrubbed throughout the case, critical for assistance with exposure, retraction, instrumentation, and closure.  Second Assistant: Marcelline Mates, MS 3   OPERATIVE IMPLANTS: Depuy Attune size 8 Posterior Stabilized Femur, with a size 7 Fixed Bearing Tibia, 5 Polyethylene insert with a 38 medialized oval dome polyethylene patella.  PREOPERATIVE INDICATIONS:  Joseph Noble is a 65 y.o. year old male with end stage bone on bone degenerative arthritis of the knee who failed conservative treatment, including injections, antiinflammatories, activity modification, and assistive devices, and had significant impairment of their activities of daily living, and elected for Total Knee Arthroplasty.   The risks, benefits, and alternatives were discussed at length including but not limited to the risks of infection, bleeding, nerve injury, stiffness, blood clots, the need for revision surgery, cardiopulmonary complications, among others, and they were willing to proceed.  OPERATIVE FINDINGS AND UNIQUE ASPECTS OF THE CASE: We had initially planned for partial knee replacement, although had discussed extensively the possibility of the need for total knee replacement.  He had a little bit of anterior tibial subluxation on preoperative x-rays, although the patellofemoral joint looked okay.  Intraoperatively his ACL was deficient, and he had substantial grooving of the lateral femoral condyle, with extensive central lateral wear.  Therefore I converted to a total knee replacement.  I did cut the femur twice, he was between a size 7 and a size 8 on the femur, I did not notch.  He tracked well at the  completion of the case, and had a 1 mm cement mantle under the patella on the medial aspect.  I suspect this was because of a slight irregularity on the initial cut.  ESTIMATED BLOOD LOSS: 150 mL  OPERATIVE DESCRIPTION:  The patient was brought to the operative room and placed in a supine position.  Spinal anesthesia was attempted, but unsuccessful, and subsequently general anesthesia was administered.  IV antibiotics were given.  The lower extremity was prepped and draped in the usual sterile fashion.  Time out was performed.  The leg was elevated and exsanguinated and the tourniquet was inflated.  I initially had him in the legholder, but after visualizing the ACL and the lateral compartment I converted to a total knee replacement position.  I dropped the tourniquet during the transition time.  I reexsanguinated the leg and reinflated the tourniquet when the transition was complete.  Anterior quadriceps tendon splitting approach was performed.  The patella was everted and osteophytes were removed.  The anterior horn of the medial and lateral meniscus was removed.   The patella was then measured, and cut with the saw.  The thickness before the cut was 26 and after the cut was 16.  A metal shield was used to protect the patella throughout the case.    The distal femur was opened with the drill and the intramedullary distal femoral cutting jig was utilized, set at 5 degrees resecting 9 mm off the distal femur.  Care was taken to protect the collateral ligaments.  Then the extramedullary tibial cutting jig was utilized making the appropriate cut using the anterior tibial crest as a reference building in appropriate posterior slope.  Care was taken  during the cut to protect the medial and collateral ligaments.  The proximal tibia was removed along with the posterior horns of the menisci.  The PCL was sacrificed.    The extensor gap was measured and found to have adequate resection, and was initially a  little bit tight, so I took 2 more millimeters off of the femur, and then it measured to a size 5..    The distal femoral sizing jig was applied, taking care to avoid notching.  This was set at 3 degrees of external rotation.  Then the 4-in-1 cutting jig was applied and the anterior and posterior femur was cut, along with the chamfer cuts.  All posterior osteophytes were removed.  The flexion gap was then measured and was symmetric with the extension gap.  I completed the distal femoral preparation using the appropriate jig to prepare the box.  The proximal tibia sized and prepared accordingly with the reamer and the punch, and then all components were trialed with the poly insert.  The knee was found to have excellent balance and full motion.    The above named components were then cemented into place and all excess cement was removed.  The real polyethylene implant was placed.  After the cement had cured I released the tourniquet and confirmed excellent hemostasis with no major posterior vessel injury.    The knee was easily taken through a range of motion and the patella tracked well and the knee irrigated copiously and the parapatellar and subcutaneous tissue closed with vicryl, and monocryl with steri strips for the skin.  The wounds were injected with marcaine, and dressed with sterile gauze and the patient was awakened and returned to the PACU in stable and satisfactory condition.  There were no complications.  Total tourniquet time was 81 minutes.

## 2020-12-25 NOTE — Discharge Instructions (Signed)

## 2020-12-25 NOTE — Evaluation (Signed)
Physical Therapy Evaluation Patient Details Name: Joseph Noble MRN: [id number] DOB: 01/14/1956 Today's Date: 12/25/2020  History of Present Illness  65 yo  male s/p R TKA. PMH: HTN     Pt is s/p TKA resulting in the deficits listed below (see PT Problem List).  Pt with c/o being uncomfortable, restless. Amb ~ 6' with RW and min assist, distance being limited by mild dizziness and nausea. RN aware.   Pt will benefit from skilled PT to increase their independence and safety with mobility to allow discharge to the venue listed below.       Recommendations for follow up therapy are one component of a multi-disciplinary discharge planning process, led by the attending physician.  Recommendations may be updated based on patient status, additional functional criteria and insurance authorization.  Follow Up Recommendations Follow surgeon's recommendation for DC plan and follow-up therapies    Equipment Recommendations  Rolling walker with 5" wheels    Recommendations for Other Services       Precautions / Restrictions Precautions Precautions: Knee Required Braces or Orthoses: Knee Immobilizer - Right Restrictions Weight Bearing Restrictions: No Other Position/Activity Restrictions: WBAT      Mobility  Bed Mobility Overal bed mobility: Needs Assistance Bed Mobility: Supine to Sit     Supine to sit: Min guard     General bed mobility comments: for safety    Transfers Overall transfer level: Needs assistance Equipment used: Rolling walker (2 wheeled) Transfers: Sit to/from Stand Sit to Stand: Min assist         General transfer comment: assist to rise and transition to RW, cues for hand placement and RLE position  Ambulation/Gait Ambulation/Gait assistance: Min assist Gait Distance (Feet):  30 Feet Assistive device: Rolling walker (2 wheeled) Gait Pattern/deviations: Step-to pattern;Decreased stance time - right     General Gait Details: cues for sequence and RW  safety, 3 standing rest. Mild dizziness and nausea--chair to pt for seated rest   Stairs            Wheelchair Mobility    Modified Rankin (Stroke Patients Only)       Balance                                             Pertinent Vitals/Pain Pain Assessment: 0-10 Pain Score: 9  Pain Location: right knee Pain Descriptors / Indicators: Aching;Grimacing;Sore Pain Intervention(s): Limited activity within patient's tolerance;Monitored during session;Premedicated before session;Repositioned    Home Living Family/patient expects to be discharged to:: Private residence Living Arrangements: Spouse/significant other Available Help at Discharge: Family Type of Home: House (townhouse) Home Access: Stairs to enter Entrance Stairs-Rails: None Secretary/administrator of Steps: 2+3 + 1 Home Layout: Two level Home Equipment: None      Prior Function Level of Independence: Independent               Hand Dominance        Extremity/Trunk Assessment   Upper Extremity Assessment Upper Extremity Assessment: Overall WFL for tasks assessed    Lower Extremity Assessment Lower Extremity Assessment: RLE deficits/detail RLE Deficits / Details: ankle WFL, knee and hip grossly 2+ to 3/5. knee flexion ~7 to 65 degrees       Communication   Communication: No difficulties  Cognition Arousal/Alertness: Awake/alert Behavior During Therapy: restless Overall Cognitive Status: Within Functional Limits for tasks assessed  General Comments: diffiuclty focusing, occasional redirection and repetition required      General Comments      Exercises Total Joint Exercises Ankle Circles/Pumps: AROM;Both;5 reps   Assessment/Plan    PT Assessment Patient needs continued PT services  PT Problem List Decreased strength;Decreased range of motion;Decreased mobility;Decreased activity tolerance;Decreased knowledge of use of  DME;Pain       PT Treatment Interventions DME instruction;Therapeutic activities;Gait training;Therapeutic exercise;Patient/family education;Functional mobility training;Stair training    PT Goals (Current goals can be found in the Care Plan section)  Acute Rehab PT Goals Patient Stated Goal: home PT Goal Formulation: With patient Time For Goal Achievement: 01/01/21 Potential to Achieve Goals: Good    Frequency 7X/week   Barriers to discharge        Co-evaluation               AM-PAC PT "6 Clicks" Mobility  Outcome Measure Help needed turning from your back to your side while in a flat bed without using bedrails?: A Little Help needed moving from lying on your back to sitting on the side of a flat bed without using bedrails?: A Little Help needed moving to and from a bed to a chair (including a wheelchair)?: A Little Help needed standing up from a chair using your arms (e.g., wheelchair or bedside chair)?: A Little Help needed to walk in hospital room?: A Little Help needed climbing 3-5 steps with a railing? : A Lot 6 Click Score: 17    End of Session Equipment Utilized During Treatment: Gait belt Activity Tolerance: Patient tolerated treatment well Patient left: with call bell/phone within reach;in chair;with chair alarm set;with family/visitor present Nurse Communication: Mobility status PT Visit Diagnosis: Other abnormalities of gait and mobility (R26.89);Difficulty in walking, not elsewhere classified (R26.2)    Time: 4696-2952 PT Time Calculation (min) (ACUTE ONLY): 25 min   Charges:   PT Evaluation $PT Eval Low Complexity: 1 Low PT Treatments $Gait Training: 8-22 mins        12/25/2020 Delice Bison, PT  Acute Rehab Dept [facility]) [phone] Pager [phone]  12/25/2020   Drucilla Chalet 12/25/2020, 4:02 PM

## 2020-12-25 NOTE — Transfer of Care (Signed)
Immediate Anesthesia Transfer of Care Note  Patient: ZIAN DELAIR  Procedure(s) Performed: LEFT TOTAL KNEE ARTHROPLASTY (Right: Knee)  Patient Location: PACU  Anesthesia Type:General  Level of Consciousness: drowsy  Airway & Oxygen Therapy: Patient Spontanous Breathing and Patient connected to face mask oxygen  Post-op Assessment: Report given to RN and Post -op Vital signs reviewed and stable  Post vital signs: Reviewed and stable  Last Vitals:  Vitals Value Taken Time  BP 152/97 12/25/20 1137  Temp    Pulse 66 12/25/20 1143  Resp 13 12/25/20 1143  SpO2 98 % 12/25/20 1143  Vitals shown include unvalidated device data.  Last Pain:  Vitals:   12/25/20 0553  TempSrc: Oral         Complications: No notable events documented.

## 2020-12-25 NOTE — Interval H&P Note (Signed)
History and Physical Interval Note:  12/25/2020 7:25 AM  Joseph Noble  has presented today for surgery, with the diagnosis of djd right knee.  The various methods of treatment have been discussed with the patient and family. After consideration of risks, benefits and other options for treatment, the patient has consented to  Procedure(s): UNICOMPARTMENTAL KNEE (Right) as a surgical intervention.  The patient's history has been reviewed, patient examined, no change in status, stable for surgery.  I have reviewed the patient's chart and labs.  Questions were answered to the patient's satisfaction.     Eulas Post

## 2020-12-26 ENCOUNTER — Encounter (HOSPITAL_COMMUNITY): Payer: Self-pay | Admitting: Orthopedic Surgery

## 2020-12-26 DIAGNOSIS — M1711 Unilateral primary osteoarthritis, right knee: Secondary | ICD-10-CM | POA: Diagnosis not present

## 2020-12-26 LAB — CBC
HCT: 36.1 % — ABNORMAL LOW (ref 39.0–52.0)
Hemoglobin: 12.1 g/dL — ABNORMAL LOW (ref 13.0–17.0)
MCH: 30.2 pg (ref 26.0–34.0)
MCHC: 33.5 g/dL (ref 30.0–36.0)
MCV: 90 fL (ref 80.0–100.0)
Platelets: 196 10*3/uL (ref 150–400)
RBC: 4.01 MIL/uL — ABNORMAL LOW (ref 4.22–5.81)
RDW: 13 % (ref 11.5–15.5)
WBC: 15.9 10*3/uL — ABNORMAL HIGH (ref 4.0–10.5)
nRBC: 0 % (ref 0.0–0.2)

## 2020-12-26 LAB — BASIC METABOLIC PANEL
Anion gap: 6 (ref 5–15)
BUN: 29 mg/dL — ABNORMAL HIGH (ref 8–23)
CO2: 23 mmol/L (ref 22–32)
Calcium: 8.9 mg/dL (ref 8.9–10.3)
Chloride: 109 mmol/L (ref 98–111)
Creatinine, Ser: 1.14 mg/dL (ref 0.61–1.24)
GFR, Estimated: >60 mL/min (ref >60)
Glucose, Bld: 168 mg/dL — ABNORMAL HIGH (ref 70–99)
Potassium: 4.5 mmol/L (ref 3.5–5.1)
Sodium: 138 mmol/L (ref 135–145)

## 2020-12-26 MED ORDER — BACLOFEN 10 MG PO TABS: 10.0000 mg | ORAL_TABLET | Freq: Three times a day (TID) | ORAL | 0 refills | Status: DC

## 2020-12-26 MED ORDER — OXYCODONE HCL 5 MG PO TABS: 5.0000 mg | ORAL_TABLET | ORAL | 0 refills | Status: DC | PRN

## 2020-12-26 MED ORDER — ACETAMINOPHEN 325 MG PO TABS: 650.0000 mg | ORAL_TABLET | Freq: Four times a day (QID) | ORAL | 1 refills | Status: DC | PRN

## 2020-12-26 MED ORDER — ASPIRIN EC 325 MG PO TBEC: 325.0000 mg | DELAYED_RELEASE_TABLET | Freq: Two times a day (BID) | ORAL | 0 refills | Status: DC

## 2020-12-26 MED ORDER — ONDANSETRON HCL 8 MG PO TABS: 8.0000 mg | ORAL_TABLET | Freq: Three times a day (TID) | ORAL | 0 refills | Status: AC | PRN

## 2020-12-26 MED ORDER — SENNA-DOCUSATE SODIUM 8.6-50 MG PO TABS: 2.0000 | ORAL_TABLET | Freq: Every day | ORAL | 1 refills | Status: DC

## 2020-12-26 NOTE — Progress Notes (Signed)
Physical Therapy Treatment Patient Details Name: Joseph Noble MRN: [id number] DOB: [birth year] Today's Date: 12/26/2020   History of Present Illness 65 yo  male s/p R TKA. PMH: HTN    PT Comments    Pt progressing toward goals. Will see for a second session and  pt should be ready to d/c later today with family assist as needed   Recommendations for follow up therapy are one component of a multi-disciplinary discharge planning process, led by the attending physician.  Recommendations may be updated based on patient status, additional functional criteria and insurance authorization.  Follow Up Recommendations  Follow surgeon's recommendation for DC plan and follow-up therapies     Equipment Recommendations  Rolling walker with 5" wheels;3in1 (PT)    Recommendations for Other Services       Precautions / Restrictions Precautions Precautions: Knee Restrictions Weight Bearing Restrictions: No Other Position/Activity Restrictions: WBAT     Mobility  Bed Mobility               General bed mobility comments: in recliner    Transfers Overall transfer level: Needs assistance Equipment used: Rolling walker (2 wheeled) Transfers: Sit to/from Stand Sit to Stand: Min guard         General transfer comment: cues for hand placement  Ambulation/Gait Ambulation/Gait assistance: Min guard Gait Distance (Feet): 90 Feet Assistive device: Rolling walker (2 wheeled) Gait Pattern/deviations: Step-to pattern;Decreased stance time - right     General Gait Details: cues for sequence and RW safety   Stairs             Wheelchair Mobility    Modified Rankin (Stroke Patients Only)       Balance                                            Cognition Arousal/Alertness: Awake/alert Behavior During Therapy: WFL for tasks assessed/performed Overall Cognitive Status: Within Functional Limits for tasks assessed                                         Exercises Total Joint Exercises Ankle Circles/Pumps: AROM;Both;10 reps Quad Sets: AROM;Both;10 reps Heel Slides: AROM;10 reps;AAROM;Right Hip ABduction/ADduction: AROM;AAROM;Right;10 reps Straight Leg Raises: AROM;Right;10 reps Goniometric ROM: grossly 6 to 65 degrees right knee flexion    General Comments        Pertinent Vitals/Pain Pain Assessment: 0-10 Pain Score: 4  Pain Location: right knee Pain Descriptors / Indicators: Aching;Grimacing;Sore Pain Intervention(s): Limited activity within patient's tolerance;Monitored during session;Premedicated before session;Repositioned;Ice applied    Home Living                      Prior Function            PT Goals (current goals can now be found in the care plan section) Acute Rehab PT Goals Patient Stated Goal: home PT Goal Formulation: With patient Time For Goal Achievement: 01/01/21 Potential to Achieve Goals: Good Progress towards PT goals: Progressing toward goals    Frequency    7X/week      PT Plan Current plan remains appropriate    Co-evaluation              AM-PAC PT "6 Clicks" Mobility   Outcome Measure  Help  needed turning from your back to your side while in a flat bed without using bedrails?: A Little Help needed moving from lying on your back to sitting on the side of a flat bed without using bedrails?: A Little Help needed moving to and from a bed to a chair (including a wheelchair)?: A Little Help needed standing up from a chair using your arms (e.g., wheelchair or bedside chair)?: A Little Help needed to walk in hospital room?: A Little Help needed climbing 3-5 steps with a railing? : A Little 6 Click Score: 18    End of Session Equipment Utilized During Treatment: Gait belt Activity Tolerance: Patient tolerated treatment well Patient left: with call bell/phone within reach;in chair;with chair alarm set;with family/visitor present   PT Visit Diagnosis:  Other abnormalities of gait and mobility (R26.89);Difficulty in walking, not elsewhere classified (R26.2)     Time: 5462-7035 PT Time Calculation (min) (ACUTE ONLY): 30 min  Charges:  $Gait Training: 8-22 mins $Therapeutic Exercise: 8-22 mins                     Delice Bison, PT  Acute Rehab Dept ([facility]) [phone] Pager [phone]  12/26/2020    [facility] 12/26/2020, 10:31 AM

## 2020-12-26 NOTE — Progress Notes (Signed)
Discharge package printed and instructions given to pt. Patient verbalizes understanding. 

## 2020-12-26 NOTE — Plan of Care (Signed)
  Problem: Health Behavior/Discharge Planning: Goal: Ability to manage health-related needs will improve Outcome: Progressing   Problem: Clinical Measurements: Goal: Ability to maintain clinical measurements within normal limits will improve Outcome: Progressing Goal: Will remain free from infection Outcome: Progressing Goal: Diagnostic test results will improve Outcome: Progressing Goal: Respiratory complications will improve Outcome: Progressing Goal: Cardiovascular complication will be avoided Outcome: Progressing   Problem: Activity: Goal: Risk for activity intolerance will decrease Outcome: Progressing   Problem: Nutrition: Goal: Adequate nutrition will be maintained Outcome: Progressing   Problem: Coping: Goal: Level of anxiety will decrease Outcome: Progressing   Problem: Elimination: Goal: Will not experience complications related to bowel motility Outcome: Progressing Goal: Will not experience complications related to urinary retention Outcome: Progressing   Problem: Pain Managment: Goal: General experience of comfort will improve Outcome: Progressing   Problem: Safety: Goal: Ability to remain free from injury will improve Outcome: Progressing   Problem: Skin Integrity: Goal: Risk for impaired skin integrity will decrease Outcome: Progressing   Problem: Education: Goal: Knowledge of the prescribed therapeutic regimen will improve Outcome: Progressing   Problem: Activity: Goal: Ability to avoid complications of mobility impairment will improve Outcome: Progressing Goal: Range of joint motion will improve Outcome: Progressing   Problem: Clinical Measurements: Goal: Postoperative complications will be avoided or minimized Outcome: Progressing   Problem: Pain Management: Goal: Pain level will decrease with appropriate interventions Outcome: Progressing   Problem: Skin Integrity: Goal: Will show signs of wound healing Outcome: Progressing   

## 2020-12-26 NOTE — Progress Notes (Signed)
   12/26/20 1200  PT Visit Information  Last PT Received On 12/26/20  Pt progressing well. Reviewed stairs with pt and wife. Pt is doing very well, meeting goals, ready to d/c with family assist as needed   Assistance Needed +1  History of Present Illness 65 yo  male s/p R TKA. PMH: HTN  Subjective Data  Patient Stated Goal home  Precautions  Precautions Knee  Restrictions  Other Position/Activity Restrictions WBAT  Pain Assessment  Pain Assessment 0-10  Pain Score 5  Pain Location right knee  Pain Descriptors / Indicators Aching;Grimacing;Sore  Pain Intervention(s) Monitored during session;Limited activity within patient's tolerance;Premedicated before session;Ice applied  Cognition  Arousal/Alertness Awake/alert  Behavior During Therapy WFL for tasks assessed/performed  Overall Cognitive Status Within Functional Limits for tasks assessed  Bed Mobility  General bed mobility comments in recliner  Transfers  Overall transfer level Needs assistance  Equipment used Rolling walker (2 wheeled)  Transfers Sit to/from Stand  Sit to Stand Min guard  General transfer comment cues for hand placement  Ambulation/Gait  Ambulation/Gait assistance Min guard  Gait Distance (Feet) 40 Feet  Assistive device Rolling walker (2 wheeled)  Gait Pattern/deviations Step-to pattern;Decreased stance time - right  General Gait Details cues for sequence and RW safety  Stairs Yes  Stairs assistance Min guard  Stair Management Step to pattern;Forwards;With walker  Number of Stairs 1  General stair comments cues for sequence and RW position, repeated x2 withwife assisting on second trial  PT - End of Session  Equipment Utilized During Treatment Gait belt  Activity Tolerance Patient tolerated treatment well  Patient left with call bell/phone within reach;in chair;with chair alarm set;with family/visitor present   PT - Assessment/Plan  PT Plan Current plan remains appropriate  PT Visit Diagnosis Other  abnormalities of gait and mobility (R26.89);Difficulty in walking, not elsewhere classified (R26.2)  PT Frequency (ACUTE ONLY) 7X/week  Follow Up Recommendations Follow surgeon's recommendation for DC plan and follow-up therapies  PT equipment Rolling walker with 5" wheels;3in1 (PT)  AM-PAC PT "6 Clicks" Mobility Outcome Measure (Version 2)  Help needed turning from your back to your side while in a flat bed without using bedrails? 3  Help needed moving from lying on your back to sitting on the side of a flat bed without using bedrails? 3  Help needed moving to and from a bed to a chair (including a wheelchair)? 3  Help needed standing up from a chair using your arms (e.g., wheelchair or bedside chair)? 3  Help needed to walk in hospital room? 3  Help needed climbing 3-5 steps with a railing?  3  6 Click Score 18  Consider Recommendation of Discharge To: Home with [facility]  PT Goal Progression  Progress towards PT goals Progressing toward goals  Acute Rehab PT Goals  PT Goal Formulation With patient  Time For Goal Achievement 01/01/21  Potential to Achieve Goals Good  PT Time Calculation  PT Start Time (ACUTE ONLY) 1151  PT Stop Time (ACUTE ONLY) 1205  PT Time Calculation (min) (ACUTE ONLY) 14 min  PT General Charges  $$ ACUTE PT VISIT 1 Visit  PT Treatments  $Gait Training 8-22 mins

## 2020-12-26 NOTE — TOC Transition Note (Signed)
Transition of Care [facility]) - CM/SW Discharge Note  Patient Details  Name: Joseph Noble MRN: [id number] Date of Birth: Jun 03, 1955  [facility] CM/SW Contact:  Sherie Don, LCSW Phone Number: 12/26/2020, 11:01 AM  Clinical Narrative: Patient is expected to discharge home after working with PT. CSW met with patient and his wife to review discharge plan and needs. Patient will go to OPPT at [facility]. Patient will need a rolling walker and 3N1. [employer] to deliver DME to patient's room. TOC signing off.  Final next level of care: OP Rehab Barriers to Discharge: No Barriers Identified  Patient Goals and CMS Choice Patient states their goals for this hospitalization and ongoing recovery are:: Discharge home with [place] CMS Medicare.gov Compare Post Acute Care list provided to:: Patient Choice offered to / list presented to : Patient  Discharge Plan and Services        DME Arranged: 3-N-1, Walker rolling DME Agency: [employer] Representative spoke with at DME Agency: Prearranged with OPPT  Readmission Risk Interventions No flowsheet data found.

## 2020-12-26 NOTE — Plan of Care (Signed)
  Problem: Pain Managment: Goal: General experience of comfort will improve Outcome: Progressing   Problem: Safety: Goal: Ability to remain free from injury will improve Outcome: Progressing   

## 2020-12-26 NOTE — Progress Notes (Signed)
     Subjective: 1 Day Post-Op s/p Procedure(s): LEFT TOTAL KNEE ARTHROPLASTY   Patient is alert, oriented, yes  Patient reports pain as mild.   Denies chest pain, SOB, Calf pain. No nausea/vomiting. No other complaints.  Objective:  PE: VITALS:   Vitals:   12/25/20 1617 12/25/20 2236 12/26/20 0227 12/26/20 0553  BP: (!) 141/83 (!) 145/71 (!) 150/73 (!) 145/67  Pulse: 60 60 60 60  Resp: 18 16 16 16   Temp: 98 F (36.7 C) 98.1 F (36.7 C) 98 F (36.7 C) 97.9 F (36.6 C)  TempSrc: Oral Oral Oral Oral  SpO2: 95% 99% 99% 98%  Weight:      Height:        ABD soft Sensation intact distally Intact pulses distally Dorsiflexion/Plantar flexion intact Incision: dressing C/D/I  LABS  Results for orders placed or performed during the hospital encounter of 12/25/20 (from the past 24 hour(s))  CBC     Status: Abnormal   Collection Time: 12/26/20  3:54 AM  Result Value Ref Range   WBC 15.9 (H) 4.0 - 10.5 K/uL   RBC 4.01 (L) 4.22 - 5.81 MIL/uL   Hemoglobin 12.1 (L) 13.0 - 17.0 g/dL   HCT 12/28/20 (L) 84.6 - 65.9 %   MCV 90.0 80.0 - 100.0 fL   MCH 30.2 26.0 - 34.0 pg   MCHC 33.5 30.0 - 36.0 g/dL   RDW 93.5 70.1 - 77.9 %   Platelets 196 150 - 400 K/uL   nRBC 0.0 0.0 - 0.2 %  Basic metabolic panel     Status: Abnormal   Collection Time: 12/26/20  3:54 AM  Result Value Ref Range   Sodium 138 135 - 145 mmol/L   Potassium 4.5 3.5 - 5.1 mmol/L   Chloride 109 98 - 111 mmol/L   CO2 23 22 - 32 mmol/L   Glucose, Bld 168 (H) 70 - 99 mg/dL   BUN 29 (H) 8 - 23 mg/dL   Creatinine, Ser 12/28/20 0.61 - 1.24 mg/dL   Calcium 8.9 8.9 - 3.00 mg/dL   GFR, Estimated 92.3 >30 mL/min   Anion gap 6 5 - 15    DG Knee Right Port  Result Date: 12/25/2020 CLINICAL DATA:  Status post total right knee arthroplasty. EXAM: PORTABLE RIGHT KNEE - 1-2 VIEW COMPARISON:  12/10/2020 FINDINGS: The femoral and tibial components are well seated. No complicating features are identified. IMPRESSION: Well seated  components of a total right knee arthroplasty. Electronically Signed   By: 02/09/2021 M.D.   On: 12/25/2020 15:25    Assessment/Plan: Principal Problem:   Osteoarthritis of right knee Active Problems:   S/P total knee arthroplasty, right  1 Day Post-Op s/p Procedure(s): LEFT TOTAL KNEE ARTHROPLASTY - leukocytosis, likely from surgery  Weightbearing: WBAT RLE, up with therapy Insicional and dressing care: Reinforce dressings as needed VTE prophylaxis: Aspirin 325mg  BID x 30 days Pain control: continue current regimen Follow - up plan: in 2 weeks with Dr. 12/27/2020 Dispo: ok to discharge after passing PT this morning  Contact information:   Weekdays 8-5 Dion Saucier, 12-02-2005 Janine Ores A fter hours and holidays please check [email or web address] for group call information for [facility]  [place] 12/26/2020, 8:51 AM

## 2021-07-24 NOTE — Progress Notes (Signed)
Sent message, via epic in basket, requesting orders in epic from surgeon.  

## 2021-07-31 ENCOUNTER — Inpatient Hospital Stay (HOSPITAL_COMMUNITY)
Admission: RE | Admit: 2021-07-31 | Discharge: 2021-07-31 | Disposition: A | Discharge status: Home / Self Care | Payer: Managed Care, Other (non HMO) | Source: Ambulatory Visit

## 2021-08-01 NOTE — Progress Notes (Addendum)
COVID Vaccine Completed:  Yes x1 ?Date COVID Vaccine completed: ?Has received booster:  No ?COVID vaccine manufacturer:   [employer]  ?  ?Date of COVID positive in last 90 days:  No ?  ?PCP - Bing Neighbors. Tenny Craw, MD ?Cardiologist - N/A ? ?Medical clearance on chart dated 06-19-21 by Dr. Tenny Craw ?  ?Chest x-ray - N/A ?EKG - 12-13-20 Epic ?Stress Test - N/A ?ECHO - N/A ?Cardiac Cath - N/A ?Pacemaker/ICD device last checked: ?Spinal Cord Stimulator: ?  ?Sleep Study - N/A ?CPAP -  ?  ?Prediabetes ?Fasting Blood Sugar -  ?Checks Blood Sugar - does not check  ?  ?Blood Thinner Instructions:  N/A ?Aspirin Instructions: ?Last Dose: ?  ?Activity level:     Can go up a flight of stairs and perform activities of daily living without stopping and without symptoms of chest pain or shortness of breath.                                ?  ?Anesthesia review:   Stop Bang 5 ?  ?Patient denies shortness of breath, fever, cough and chest pain at PAT appointment ?  ?  ?Patient verbalized understanding of instructions that were given to them at the PAT appointment. Patient was also instructed that they will need to review over the PAT instructions again at home before surgery.  ?

## 2021-08-01 NOTE — Patient Instructions (Addendum)
DUE TO COVID-19 ONLY TWO VISITORS  (aged 66 and older)  IS ALLOWED TO COME WITH YOU AND STAY IN THE WAITING ROOM ONLY DURING PRE OP AND PROCEDURE.   ?**NO VISITORS ARE ALLOWED IN THE SHORT STAY AREA OR RECOVERY ROOM!!** ? ?You are not required to quarantine ?Hand Hygiene often ?Do NOT share personal items ?Notify your provider if you are in close contact with someone who has COVID or you develop fever 100.4 or greater, new onset of sneezing, cough, sore throat, shortness of breath or body aches. ? ?     ? Your procedure is scheduled on:  08-13-21 ? ? Report to [facility] Main Entrance ? ?  Report to admitting at 0515 AM ? ? Call this number if you have problems the morning of surgery [phone] ? ? Do not eat food :After Midnight. ? ? After Midnight you may have the following liquids until 4:30 AM DAY OF SURGERY ? ?Water ?Black Coffee (sugar ok, NO MILK/CREAM OR CREAMERS)  ?Tea (sugar ok, NO MILK/CREAM OR CREAMERS) regular and decaf                             ?Plain Jell-O (NO RED)                                           ?Fruit ices (not with fruit pulp, NO RED)                                     ?Popsicles (NO RED)                                                                  ?Juice: apple, WHITE grape, WHITE cranberry ?Sports drinks like Gatorade (NO RED) ?Clear broth(vegetable,chicken,beef) ?         ?FOLLOW  ANY ADDITIONAL PRE OP INSTRUCTIONS YOU RECEIVED FROM YOUR SURGEON'S OFFICE!!! ?  ?  ?Oral Hygiene is also important to reduce your risk of infection.                                    ?Remember - BRUSH YOUR TEETH THE MORNING OF SURGERY WITH YOUR REGULAR TOOTHPASTE ? ? Do NOT smoke after Midnight ? ? Take these medicines the morning of surgery with A SIP OF WATER:  Allopurinol, Amlodipine, Levothyroxine, Metoprolol.  Tylenol if needed ?                  ?           You may not have any metal on your body including  jewelry, and body piercing ? ?           Do not wear lotions, powders,  cologne, or deodorant ? ?            Men may shave face and neck. ? ? Do not bring valuables to the hospital. [facility] IS NOT RESPONSIBLE   FOR  VALUABLES. ? ? Contacts, dentures or bridgework may not be worn into surgery. ?   ?Patients discharged on the day of surgery will not be allowed to drive home.  Someone NEEDS to stay with you for the first 24 hours after anesthesia. ? ?Please read over the following fact sheets you were given: IF YOU HAVE QUESTIONS ABOUT YOUR PRE-OP INSTRUCTIONS PLEASE CALL [phone] Gwen  ? ?[facility] - Preparing for Surgery ?Before surgery, you can play an important role.  Because skin is not sterile, your skin needs to be as free of germs as possible.  You can reduce the number of germs on your skin by washing with CHG (chlorahexidine gluconate) soap before surgery.  CHG is an antiseptic cleaner which kills germs and bonds with the skin to continue killing germs even after washing. ?Please DO NOT use if you have an allergy to CHG or antibacterial soaps.  If your skin becomes reddened/irritated stop using the CHG and inform your nurse when you arrive at Short Stay. ?Do not shave (including legs and underarms) for at least 48 hours prior to the first CHG shower.  You may shave your face/neck. ? ?Please follow these instructions carefully: ? 1.  Shower with CHG Soap the night before surgery and the  morning of surgery. ? 2.  If you choose to wash your hair, wash your hair first as usual with your normal  shampoo. ? 3.  After you shampoo, rinse your hair and body thoroughly to remove the shampoo.                            ? 4.  Use CHG as you would any other liquid soap.  You can apply chg directly to the skin and wash.  Gently with a scrungie or clean washcloth. ? 5.  Apply the CHG Soap to your body ONLY FROM THE NECK DOWN.   Do   not use on face/ open      ?                     Wound or open sores. Avoid contact with eyes, ears mouth and   genitals (private parts).  ?                      Engineering geologist,  Genitals (private parts) with your normal soap. ?            6.  Wash thoroughly, paying special attention to the area where your    surgery  will be performed. ? 7.  Thoroughly rinse your body with warm water from the neck down. ? 8.  DO NOT shower/wash with your normal soap after using and rinsing off the CHG Soap. ?               9.  Pat yourself dry with a clean towel. ?           10.  Wear clean pajamas. ?           11.  Place clean sheets on your bed the night of your first shower and do not  sleep with pets. ?Day of Surgery : ?Do not apply any lotions/deodorants the morning of surgery.  Please wear clean clothes to the hospital/surgery center. ? ?FAILURE TO FOLLOW THESE INSTRUCTIONS MAY RESULT IN THE CANCELLATION OF YOUR SURGERY ? ?PATIENT SIGNATURE_________________________________ ? ?NURSE SIGNATURE__________________________________ ? ?________________________________________________________________________  ?  ? ?  Incentive Spirometer ? ?An incentive spirometer is a tool that can help keep your lungs clear and active. This tool measures how well you are filling your lungs with each breath. Taking long deep breaths may help reverse or decrease the chance of developing breathing (pulmonary) problems (especially infection) following: ?A long period of time when you are unable to move or be active. ?BEFORE THE PROCEDURE  ?If the spirometer includes an indicator to show your best effort, your nurse or respiratory therapist will set it to a desired goal. ?If possible, sit up straight or lean slightly forward. Try not to slouch. ?Hold the incentive spirometer in an upright position. ?INSTRUCTIONS FOR USE  ?Sit on the edge of your bed if possible, or sit up as far as you can in bed or on a chair. ?Hold the incentive spirometer in an upright position. ?Breathe out normally. ?Place the mouthpiece in your mouth and seal your lips tightly around it. ?Breathe in slowly and as deeply as possible, raising  the piston or the ball toward the top of the column. ?Hold your breath for 3-5 seconds or for as long as possible. Allow the piston or ball to fall to the bottom of the column. ?Remove the mouthpiece from your mouth and breathe out normally. ?Rest for a few seconds and repeat Steps 1 through 7 at least 10 times every 1-2 hours when you are awake. Take your time and take a few normal breaths between deep breaths. ?The spirometer may include an indicator to show your best effort. Use the indicator as a goal to work toward during each repetition. ?After each set of 10 deep breaths, practice coughing to be sure your lungs are clear. If you have an incision (the cut made at the time of surgery), support your incision when coughing by placing a pillow or rolled up towels firmly against it. ?Once you are able to get out of bed, walk around indoors and cough well. You may stop using the incentive spirometer when instructed by your caregiver.  ?RISKS AND COMPLICATIONS ?Take your time so you do not get dizzy or light-headed. ?If you are in pain, you may need to take or ask for pain medication before doing incentive spirometry. It is harder to take a deep breath if you are having pain. ?AFTER USE ?Rest and breathe slowly and easily. ?It can be helpful to keep track of a log of your progress. Your caregiver can provide you with a simple table to help with this. ?If you are using the spirometer at home, follow these instructions: ?SEEK MEDICAL CARE IF:  ?You are having difficultly using the spirometer. ?You have trouble using the spirometer as often as instructed. ?Your pain medication is not giving enough relief while using the spirometer. ?You develop fever of 100.5? F (38.1? C) or higher. ?SEEK IMMEDIATE MEDICAL CARE IF:  ?You cough up bloody sputum that had not been present before. ?You develop fever of 102? F (38.9? C) or greater. ?You develop worsening pain at or near the incision site. ?MAKE SURE YOU:  ?Understand these  instructions. ?Will watch your condition. ?Will get help right away if you are not doing well or get worse. ?Document Released: 07/28/2006 Document Revised: 06/09/2011 Document Reviewed: 09/28/2006 ?ExitC

## 2021-08-06 ENCOUNTER — Encounter (HOSPITAL_COMMUNITY)
Admission: RE | Admit: 2021-08-06 | Discharge: 2021-08-06 | Disposition: A | Discharge status: Home / Self Care | Payer: Managed Care, Other (non HMO) | Source: Ambulatory Visit | Attending: Orthopedic Surgery | Admitting: Orthopedic Surgery

## 2021-08-06 ENCOUNTER — Encounter (HOSPITAL_COMMUNITY): Payer: Self-pay

## 2021-08-06 ENCOUNTER — Other Ambulatory Visit: Payer: Self-pay

## 2021-08-06 VITALS — BP 135/88 | HR 62 | Temp 99.7°F | Resp 18 | Ht 72.0 in | Wt 279.4 lb

## 2021-08-06 DIAGNOSIS — Z01812 Encounter for preprocedural laboratory examination: Secondary | ICD-10-CM | POA: Diagnosis present

## 2021-08-06 DIAGNOSIS — R7303 Prediabetes: Secondary | ICD-10-CM | POA: Insufficient documentation

## 2021-08-06 DIAGNOSIS — I2510 Atherosclerotic heart disease of native coronary artery without angina pectoris: Secondary | ICD-10-CM | POA: Insufficient documentation

## 2021-08-06 DIAGNOSIS — Z01818 Encounter for other preprocedural examination: Secondary | ICD-10-CM

## 2021-08-06 LAB — BASIC METABOLIC PANEL
Anion gap: 11 (ref 5–15)
BUN: 19 mg/dL (ref 8–23)
CO2: 23 mmol/L (ref 22–32)
Calcium: 9.1 mg/dL (ref 8.9–10.3)
Chloride: 106 mmol/L (ref 98–111)
Creatinine, Ser: 1.06 mg/dL (ref 0.61–1.24)
GFR, Estimated: >60 mL/min (ref >60)
Glucose, Bld: 175 mg/dL — ABNORMAL HIGH (ref 70–99)
Potassium: 4.1 mmol/L (ref 3.5–5.1)
Sodium: 140 mmol/L (ref 135–145)

## 2021-08-06 LAB — CBC
HCT: 45.1 % (ref 39.0–52.0)
Hemoglobin: 15.1 g/dL (ref 13.0–17.0)
MCH: 29.8 pg (ref 26.0–34.0)
MCHC: 33.5 g/dL (ref 30.0–36.0)
MCV: 89.1 fL (ref 80.0–100.0)
Platelets: 180 10*3/uL (ref 150–400)
RBC: 5.06 MIL/uL (ref 4.22–5.81)
RDW: 13.8 % (ref 11.5–15.5)
WBC: 10.1 10*3/uL (ref 4.0–10.5)
nRBC: 0 % (ref 0.0–0.2)

## 2021-08-06 LAB — HEMOGLOBIN A1C
Hgb A1c MFr Bld: 5.6 % (ref 4.8–5.6)
Mean Plasma Glucose: 114.02 mg/dL

## 2021-08-06 LAB — GLUCOSE, CAPILLARY: Glucose-Capillary: 181 mg/dL — ABNORMAL HIGH (ref 70–99)

## 2021-08-06 LAB — SURGICAL PCR SCREEN
MRSA, PCR: NEGATIVE
Staphylococcus aureus: POSITIVE — AB

## 2021-08-06 NOTE — Progress Notes (Signed)
?   08/06/21 0915  ?OBSTRUCTIVE SLEEP APNEA  ?Have you ever been diagnosed with sleep apnea through a sleep study? No  ?Do you snore loudly (loud enough to be heard through closed doors)?  0  ?Do you often feel tired, fatigued, or sleepy during the daytime (such as falling asleep during driving or talking to someone)? 0  ?Has anyone observed you stop breathing during your sleep? 0  ?Do you have, or are you being treated for high blood pressure? 1  ?BMI more than 35 kg/m2? 1  ?Age > 50 (1-yes) 1  ?Neck circumference greater than:Male 16 inches or larger, Male 17inches or larger? 1  ?Male Gender (Yes=1) 1  ?Obstructive Sleep Apnea Score 5  ?Score 5 or greater  Results sent to PCP  ? ? ?

## 2021-08-06 NOTE — Progress Notes (Signed)
PCR results sent to Dr. Landau to review. 

## 2021-08-12 NOTE — H&P (Signed)
KNEE ARTHROPLASTY ADMISSION H&P ? ?Patient ID: ?Joseph Noble ?MRN: [id number] ?DOB/AGE: 66-Jul-1957 66 y.o. ? ?Chief Complaint: left knee pain. ? ?Planned Procedure Date: 08/13/21 ?Medical Clearance by Dr. Harrington Challenger   ? ? ?HPI: ?[name] is a 66 y.o. 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 The patient has a history of pain and functional disability in the left knee due to arthritis and has failed non-surgical conservative treatments for greater than 12 weeks to include NSAID's and/or analgesics, corticosteriod injections, viscosupplementation injections, and activity modification.  Onset of symptoms was gradual, starting 8 years ago with gradually worsening course since that time. The patient noted no past surgery on the left knee.  Patient currently rates pain at 8 out of 10 with activity. Patient has worsening of pain with activity and weight bearing and pain that interferes with activities of daily living.  Patient has evidence of joint space narrowing by imaging studies.  There is no active infection. ? ?Past Medical History:  ?Diagnosis Date  ? Arthritis   ? History of kidney stones   ? Hyperlipidemia   ? Hypertension   ? Hypothyroidism   ? Wears glasses   ? ?Past Surgical History:  ?Procedure Laterality Date  ? COLONOSCOPY    ? HERNIA REPAIR  04/01/2007  ? [facility]  ? INGUINAL HERNIA REPAIR Right 04/21/2013  ? Procedure: HERNIA REPAIR INGUINAL ADULT;  Surgeon: Zenovia Jarred, MD;  Location: [facility];  Service: General;  Laterality: Right;  ? INSERTION OF MESH Right 04/21/2013  ? Procedure: INSERTION OF MESH;  Surgeon: Zenovia Jarred, MD;  Location: [facility];  Service: General;  Laterality: Right;  ? PARTIAL KNEE ARTHROPLASTY Right 12/25/2020  ? Procedure: LEFT TOTAL KNEE ARTHROPLASTY;  Surgeon: Marchia Bond, MD;  Location: [facility];  Service: Orthopedics;  Laterality: Right;  ? TONSILLECTOMY    ? URETEROSCOPY    ? ?Allergies  ?Allergen Reactions  ? Codeine Itching and  Rash  ? ?Prior to Admission medications   ?Medication Sig Start Date End Date Taking? Authorizing Provider  ?acyclovir (ZOVIRAX) 200 MG capsule Take 400 mg by mouth 3 (three) times daily as needed (fever blister).   Yes [provider]  ?allopurinol (ZYLOPRIM) 300 MG tablet Take 300 mg by mouth daily. 03/21/13  Yes [provider]  ?amLODipine (NORVASC) 5 MG tablet Take 5 mg by mouth daily.   Yes [provider]  ?amoxicillin (AMOXIL) 500 MG tablet Take 2,000 mg by mouth See admin instructions. Take 2000 mg 1 hour prior to dental work   Yes [provider]  ?ibuprofen (ADVIL) 800 MG tablet Take 800 mg by mouth 2 (two) times daily.   Yes [provider]  ?levothyroxine (SYNTHROID) 75 MCG tablet Take 75 mcg by mouth daily before breakfast.   Yes [provider]  ?lisinopril (PRINIVIL,ZESTRIL) 40 MG tablet Take 40 mg by mouth daily. 02/19/13  Yes [provider]  ?loratadine (CLARITIN) 10 MG tablet Take 10 mg by mouth at bedtime.   Yes [provider]  ?metoprolol succinate (TOPROL-XL) 100 MG 24 hr tablet Take 100 mg by mouth at bedtime. 01/20/13  Yes [provider]  ?ondansetron (ZOFRAN) 8 MG tablet Take 1 tablet (8 mg total) by mouth every 8 (eight) hours as needed for nausea or vomiting. 12/26/20  Yes Merlene Pulling K, PA-C  ?rosuvastatin (CRESTOR) 5 MG tablet Take 5 mg by mouth at bedtime. 11/20/20  Yes [provider]  ?Vitamin  D, Ergocalciferol, (DRISDOL) 1.25 MG (50000 UNIT) CAPS capsule Take 50,000 Units by mouth every 7 (seven) days. 11/10/20  Yes [provider]  ? ?Social History  ? ?Socioeconomic History  ? Marital status: Married  ?  Spouse name: Not on file  ? Number of children: Not on file  ? Years of education: Not on file  ? Highest education level: Not on file  ?Occupational History  ? Not on file  ?Tobacco Use  ? Smoking status: Never  ? Smokeless tobacco: Current  ?  Types: Chew  ?Vaping Use  ? Vaping  Use: Never used  ?Substance and Sexual Activity  ? Alcohol use: No  ?  Comment: rare  ? Drug use: No  ? Sexual activity: Not on file  ?Other Topics Concern  ? Not on file  ?Social History Narrative  ? Not on file  ? ?Social Determinants of Health  ? ?Financial Resource Strain: Not on file  ?Food Insecurity: Not on file  ?Transportation Needs: Not on file  ?Physical Activity: Not on file  ?Stress: Not on file  ?Social Connections: Not on file  ? ?Family History  ?Problem Relation Age of Onset  ? Cancer Mother   ?     leukemia  ? ? ?ROS: Currently denies lightheadedness, dizziness, Fever, chills, CP, SOB.   ?No personal history of DVT, PE, MI, or CVA. ?No loose teeth or dentures ?All other systems have been reviewed and were otherwise currently negative with the exception of those mentioned in the HPI and as above. ? ?Objective: ?Vitals: Ht: 6\' 0"  Wt:280.8 lbs Temp: 97.8 BP: 156/85 Pulse: 56 O2 98% on room air.   ?Physical Exam: ?General: Alert, NAD.  Antalgic Gait  ?HEENT: EOMI, Good Neck Extension  ?Pulm: No increased work of breathing.  Clear B/L A/P w/o crackle or wheeze.  ?CV: RRR, No m/g/r appreciated  ?GI: soft, NT, ND ?Neuro: Neuro without gross focal deficit.  Sensation intact distally ?Skin: No lesions in the area of chief complaint ?MSK/Surgical Site: left knee w/o redness or effusion.  No medial or lateral JLT. ROM 0-115.  5/5 strength in extension and flexion.  +EHL/FHL.  NVI.  Stable varus and valgus stress.  ? ? ?Imaging Review ?Plain radiographs demonstrate severe degenerative joint disease of the left knee.  ? ?The overall alignment issignificant varus. The bone quality appears to be adequate for age and reported activity level. ? ?Preoperative templating of the joint replacement has been completed, documented, and submitted to the Operating Room personnel in order to optimize intra-operative equipment management. ? ?Assessment: ?djd left knee ?Active Problems: ?  * No active hospital problems.  * ? ? ?Plan: ?Plan for Procedure(s): ?TOTAL KNEE ARTHROPLASTY ? ?The patient history, physical exam, clinical judgement of the provider and imaging are consistent with end stage degenerative joint disease and total joint arthroplasty is deemed medically necessary. The treatment options including medical management, injection therapy, and arthroplasty were discussed at length. The risks and benefits of Procedure(s): ?TOTAL KNEE ARTHROPLASTY were presented and reviewed.  ?The risks of nonoperative treatment, versus surgical intervention including but not limited to continued pain, aseptic loosening, stiffness, dislocation/subluxation, infection, bleeding, nerve injury, blood clots, cardiopulmonary complications, morbidity, mortality, among others were discussed. The patient verbalizes understanding and wishes to proceed with the plan.  ?Patient is being admitted for inpatient treatment for surgery, pain control, PT, prophylactic antibiotics, VTE prophylaxis, progressive ambulation, ADL's and discharge planning.  ? ?Dental prophylaxis discussed and recommended for 2 years postoperatively. ? ?  The patient does meet the criteria for TXA which will be used perioperatively.   ?ASA 325 mg will be used postoperatively for DVT prophylaxis in addition to SCDs, and early ambulation. ?The patient is planning to be discharged home with OPPT in care of wife ? ? ?Ventura Bruns, PA-C ?08/12/2021 ?2:09 PM  ?

## 2021-08-13 ENCOUNTER — Encounter (HOSPITAL_COMMUNITY): Payer: Self-pay | Admitting: Orthopedic Surgery

## 2021-08-13 ENCOUNTER — Ambulatory Visit (HOSPITAL_COMMUNITY): Payer: Managed Care, Other (non HMO) | Admitting: Certified Registered"

## 2021-08-13 ENCOUNTER — Encounter (HOSPITAL_COMMUNITY)
Admission: RE | Disposition: A | Discharge status: Home / Self Care | Payer: Self-pay | Source: Home / Self Care | Attending: Orthopedic Surgery

## 2021-08-13 ENCOUNTER — Ambulatory Visit (HOSPITAL_COMMUNITY)
Admission: RE | Admit: 2021-08-13 | Discharge: 2021-08-13 | Disposition: A | Discharge status: Home / Self Care | Payer: Managed Care, Other (non HMO) | Attending: Orthopedic Surgery | Admitting: Orthopedic Surgery

## 2021-08-13 ENCOUNTER — Ambulatory Visit (HOSPITAL_BASED_OUTPATIENT_CLINIC_OR_DEPARTMENT_OTHER): Payer: Managed Care, Other (non HMO) | Admitting: Certified Registered"

## 2021-08-13 ENCOUNTER — Other Ambulatory Visit: Payer: Self-pay

## 2021-08-13 ENCOUNTER — Ambulatory Visit (HOSPITAL_COMMUNITY): Payer: Managed Care, Other (non HMO)

## 2021-08-13 DIAGNOSIS — M1712 Unilateral primary osteoarthritis, left knee: Secondary | ICD-10-CM

## 2021-08-13 DIAGNOSIS — I10 Essential (primary) hypertension: Secondary | ICD-10-CM | POA: Insufficient documentation

## 2021-08-13 DIAGNOSIS — E039 Hypothyroidism, unspecified: Secondary | ICD-10-CM | POA: Insufficient documentation

## 2021-08-13 HISTORY — PX: TOTAL KNEE ARTHROPLASTY: SHX125

## 2021-08-13 LAB — GLUCOSE, CAPILLARY: Glucose-Capillary: 106 mg/dL — ABNORMAL HIGH (ref 70–99)

## 2021-08-13 SURGERY — ARTHROPLASTY, KNEE, TOTAL
Anesthesia: Spinal | Site: Knee | Laterality: Left

## 2021-08-13 MED ORDER — DEXAMETHASONE SODIUM PHOSPHATE 10 MG/ML IJ SOLN
INTRAMUSCULAR | Status: DC | PRN
  Administered 2021-08-13: 8 mg via INTRAVENOUS

## 2021-08-13 MED ORDER — TRANEXAMIC ACID-NACL 1000-0.7 MG/100ML-% IV SOLN
1000.0000 mg | INTRAVENOUS | Status: AC
  Administered 2021-08-13: 1000 mg via INTRAVENOUS
  Filled 2021-08-13: qty 100

## 2021-08-13 MED ORDER — POVIDONE-IODINE 10 % EX SWAB: 2.0000 "application " | Freq: Once | CUTANEOUS | Status: DC

## 2021-08-13 MED ORDER — ACETAMINOPHEN 325 MG PO TABS: 325.0000 mg | ORAL_TABLET | Freq: Once | ORAL | Status: DC | PRN

## 2021-08-13 MED ORDER — ORAL CARE MOUTH RINSE: 15.0000 mL | Freq: Once | OROMUCOSAL | Status: AC

## 2021-08-13 MED ORDER — ONDANSETRON HCL 4 MG/2ML IJ SOLN
INTRAMUSCULAR | Status: DC | PRN
  Administered 2021-08-13: 4 mg via INTRAVENOUS

## 2021-08-13 MED ORDER — DEXAMETHASONE SODIUM PHOSPHATE 10 MG/ML IJ SOLN
INTRAMUSCULAR | Status: AC
  Filled 2021-08-13: qty 1

## 2021-08-13 MED ORDER — ACETAMINOPHEN 160 MG/5ML PO SOLN: 325.0000 mg | Freq: Once | ORAL | Status: DC | PRN

## 2021-08-13 MED ORDER — KETOROLAC TROMETHAMINE 30 MG/ML IJ SOLN
INTRAMUSCULAR | Status: DC | PRN
  Administered 2021-08-13: 30 mg via INTRA_ARTICULAR

## 2021-08-13 MED ORDER — BUPIVACAINE HCL (PF) 0.25 % IJ SOLN
INTRAMUSCULAR | Status: AC
  Filled 2021-08-13: qty 30

## 2021-08-13 MED ORDER — HYDROCODONE-ACETAMINOPHEN 10-325 MG PO TABS: 1.0000 | ORAL_TABLET | ORAL | 0 refills | Status: AC | PRN

## 2021-08-13 MED ORDER — EPHEDRINE 5 MG/ML INJ
INTRAVENOUS | Status: AC
  Filled 2021-08-13: qty 5

## 2021-08-13 MED ORDER — MIDAZOLAM HCL 2 MG/2ML IJ SOLN
INTRAMUSCULAR | Status: DC | PRN
  Administered 2021-08-13: 2 mg via INTRAVENOUS

## 2021-08-13 MED ORDER — FENTANYL CITRATE (PF) 100 MCG/2ML IJ SOLN
INTRAMUSCULAR | Status: AC
  Filled 2021-08-13: qty 2

## 2021-08-13 MED ORDER — BACLOFEN 10 MG PO TABS: 10.0000 mg | ORAL_TABLET | Freq: Three times a day (TID) | ORAL | 0 refills | Status: AC

## 2021-08-13 MED ORDER — BUPIVACAINE IN DEXTROSE 0.75-8.25 % IT SOLN
INTRATHECAL | Status: DC | PRN
  Administered 2021-08-13: 1.6 mL via INTRATHECAL

## 2021-08-13 MED ORDER — LACTATED RINGERS IV BOLUS
500.0000 mL | Freq: Once | INTRAVENOUS | Status: AC
  Administered 2021-08-13: 500 mL via INTRAVENOUS

## 2021-08-13 MED ORDER — ACETAMINOPHEN 10 MG/ML IV SOLN: 1000.0000 mg | Freq: Once | INTRAVENOUS | Status: DC | PRN

## 2021-08-13 MED ORDER — ONDANSETRON HCL 8 MG PO TABS: 8.0000 mg | ORAL_TABLET | Freq: Three times a day (TID) | ORAL | 0 refills | Status: AC | PRN

## 2021-08-13 MED ORDER — TRANEXAMIC ACID-NACL 1000-0.7 MG/100ML-% IV SOLN
1000.0000 mg | Freq: Once | INTRAVENOUS | Status: AC
  Administered 2021-08-13: 1000 mg via INTRAVENOUS

## 2021-08-13 MED ORDER — METHOCARBAMOL 500 MG PO TABS
ORAL_TABLET | ORAL | Status: AC
  Filled 2021-08-13: qty 1

## 2021-08-13 MED ORDER — WATER FOR IRRIGATION, STERILE IR SOLN
Status: DC | PRN
Start: 2021-08-13 — End: 2021-08-13
  Administered 2021-08-13: 2000 mL

## 2021-08-13 MED ORDER — SENNA-DOCUSATE SODIUM 8.6-50 MG PO TABS: 2.0000 | ORAL_TABLET | Freq: Every day | ORAL | 1 refills | Status: AC

## 2021-08-13 MED ORDER — AMISULPRIDE (ANTIEMETIC) 5 MG/2ML IV SOLN: 10.0000 mg | Freq: Once | INTRAVENOUS | Status: DC | PRN

## 2021-08-13 MED ORDER — LACTATED RINGERS IV BOLUS
250.0000 mL | Freq: Once | INTRAVENOUS | Status: AC
  Administered 2021-08-13: 250 mL via INTRAVENOUS

## 2021-08-13 MED ORDER — ASPIRIN EC 325 MG PO TBEC: 325.0000 mg | DELAYED_RELEASE_TABLET | Freq: Two times a day (BID) | ORAL | 0 refills | Status: AC

## 2021-08-13 MED ORDER — KETOROLAC TROMETHAMINE 30 MG/ML IJ SOLN
INTRAMUSCULAR | Status: AC
  Filled 2021-08-13: qty 1

## 2021-08-13 MED ORDER — ONDANSETRON HCL 4 MG/2ML IJ SOLN
INTRAMUSCULAR | Status: AC
  Filled 2021-08-13: qty 2

## 2021-08-13 MED ORDER — PROPOFOL 500 MG/50ML IV EMUL
INTRAVENOUS | Status: DC | PRN
Start: 2021-08-13 — End: 2021-08-13
  Administered 2021-08-13: 110 ug/kg/min via INTRAVENOUS

## 2021-08-13 MED ORDER — SODIUM CHLORIDE 0.9 % IR SOLN
Status: DC | PRN
  Administered 2021-08-13: 1000 mL

## 2021-08-13 MED ORDER — EPHEDRINE SULFATE-NACL 50-0.9 MG/10ML-% IV SOSY
PREFILLED_SYRINGE | INTRAVENOUS | Status: DC | PRN
  Administered 2021-08-13: 10 mg via INTRAVENOUS
  Administered 2021-08-13: 5 mg via INTRAVENOUS

## 2021-08-13 MED ORDER — POVIDONE-IODINE 10 % EX SWAB
2.0000 "application " | Freq: Once | CUTANEOUS | Status: AC
  Administered 2021-08-13: 2 via TOPICAL

## 2021-08-13 MED ORDER — PROPOFOL 10 MG/ML IV BOLUS
INTRAVENOUS | Status: AC
  Filled 2021-08-13: qty 20

## 2021-08-13 MED ORDER — MIDAZOLAM HCL 2 MG/2ML IJ SOLN
INTRAMUSCULAR | Status: AC
  Filled 2021-08-13: qty 2

## 2021-08-13 MED ORDER — BUPIVACAINE HCL 0.25 % IJ SOLN
INTRAMUSCULAR | Status: DC | PRN
  Administered 2021-08-13: 30 mL via INTRA_ARTICULAR

## 2021-08-13 MED ORDER — POVIDONE-IODINE 7.5 % EX SOLN: Freq: Once | CUTANEOUS | Status: DC

## 2021-08-13 MED ORDER — PROPOFOL 10 MG/ML IV BOLUS
INTRAVENOUS | Status: DC | PRN
  Administered 2021-08-13 (×2): 20 mg via INTRAVENOUS

## 2021-08-13 MED ORDER — MEPERIDINE HCL 50 MG/ML IJ SOLN: 6.2500 mg | INTRAMUSCULAR | Status: DC | PRN

## 2021-08-13 MED ORDER — CEFAZOLIN SODIUM-DEXTROSE 2-4 GM/100ML-% IV SOLN
2.0000 g | INTRAVENOUS | Status: AC
  Administered 2021-08-13: 2 g via INTRAVENOUS
  Filled 2021-08-13: qty 100

## 2021-08-13 MED ORDER — METHOCARBAMOL 500 MG PO TABS
500.0000 mg | ORAL_TABLET | Freq: Four times a day (QID) | ORAL | Status: DC | PRN
  Administered 2021-08-13: 500 mg via ORAL

## 2021-08-13 MED ORDER — ACETAMINOPHEN 500 MG PO TABS
1000.0000 mg | ORAL_TABLET | Freq: Once | ORAL | Status: AC
  Administered 2021-08-13: 1000 mg via ORAL
  Filled 2021-08-13: qty 2

## 2021-08-13 MED ORDER — LACTATED RINGERS IV SOLN: INTRAVENOUS | Status: DC

## 2021-08-13 MED ORDER — HYDROCODONE-ACETAMINOPHEN 5-325 MG PO TABS
ORAL_TABLET | ORAL | Status: AC
  Filled 2021-08-13: qty 2

## 2021-08-13 MED ORDER — PROPOFOL 1000 MG/100ML IV EMUL
INTRAVENOUS | Status: AC
  Filled 2021-08-13: qty 100

## 2021-08-13 MED ORDER — METHOCARBAMOL 500 MG IVPB - SIMPLE MED: 500.0000 mg | Freq: Four times a day (QID) | INTRAVENOUS | Status: DC | PRN

## 2021-08-13 MED ORDER — FENTANYL CITRATE (PF) 100 MCG/2ML IJ SOLN
INTRAMUSCULAR | Status: DC | PRN
  Administered 2021-08-13 (×2): 50 ug via INTRAVENOUS

## 2021-08-13 MED ORDER — TRANEXAMIC ACID-NACL 1000-0.7 MG/100ML-% IV SOLN
INTRAVENOUS | Status: AC
  Filled 2021-08-13: qty 100

## 2021-08-13 MED ORDER — CHLORHEXIDINE GLUCONATE 0.12 % MT SOLN
15.0000 mL | Freq: Once | OROMUCOSAL | Status: AC
  Administered 2021-08-13: 15 mL via OROMUCOSAL

## 2021-08-13 MED ORDER — LIDOCAINE HCL (PF) 2 % IJ SOLN
INTRAMUSCULAR | Status: AC
  Filled 2021-08-13: qty 5

## 2021-08-13 MED ORDER — CEFAZOLIN SODIUM-DEXTROSE 2-4 GM/100ML-% IV SOLN: 2.0000 g | Freq: Four times a day (QID) | INTRAVENOUS | Status: DC

## 2021-08-13 MED ORDER — HYDROMORPHONE HCL 1 MG/ML IJ SOLN: 0.2500 mg | INTRAMUSCULAR | Status: DC | PRN

## 2021-08-13 MED ORDER — HYDROCODONE-ACETAMINOPHEN 5-325 MG PO TABS
1.0000 | ORAL_TABLET | ORAL | Status: DC | PRN
  Administered 2021-08-13: 2 via ORAL

## 2021-08-13 MED ORDER — PROPOFOL 500 MG/50ML IV EMUL
INTRAVENOUS | Status: AC
  Filled 2021-08-13: qty 100

## 2021-08-13 MED ORDER — ROPIVACAINE HCL 5 MG/ML IJ SOLN
INTRAMUSCULAR | Status: DC | PRN
  Administered 2021-08-13: 30 mL via PERINEURAL

## 2021-08-13 SURGICAL SUPPLY — 57 items
ATTUNE MED DOME PAT 38 KNEE (Knees) ×1 IMPLANT
ATTUNE PS FEM LT SZ 7 CEM KNEE (Femur) ×1 IMPLANT
BAG COUNTER SPONGE SURGICOUNT (BAG) IMPLANT
BAG SPEC THK2 15X12 ZIP CLS (MISCELLANEOUS)
BAG SPNG CNTER NS LX DISP (BAG)
BAG ZIPLOCK 12X15 (MISCELLANEOUS) IMPLANT
BASE TIBIAL CEM ATTUNE SZ 7 (Knees) ×2 IMPLANT
BASEPLATE TIB CEM ATTUNE SZ7 (Knees) IMPLANT
BLADE SAG 18X100X1.27 (BLADE) ×2 IMPLANT
BLADE SAW SGTL 11.0X1.19X90.0M (BLADE) ×2 IMPLANT
BLADE SAW SGTL 13X75X1.27 (BLADE) ×2 IMPLANT
BLADE SURG 15 STRL LF DISP TIS (BLADE) ×1 IMPLANT
BLADE SURG 15 STRL SS (BLADE) ×2
BNDG CMPR MED 10X6 ELC LF (GAUZE/BANDAGES/DRESSINGS) ×1
BNDG ELASTIC 6X10 VLCR STRL LF (GAUZE/BANDAGES/DRESSINGS) ×2 IMPLANT
BOWL SMART MIX CTS (DISPOSABLE) ×2 IMPLANT
BSPLAT TIB 7 CMNT FX BRNG STRL (Knees) ×1 IMPLANT
CEMENT HV SMART SET (Cement) ×4 IMPLANT
CLSR STERI-STRIP ANTIMIC 1/2X4 (GAUZE/BANDAGES/DRESSINGS) ×4 IMPLANT
COVER SURGICAL LIGHT HANDLE (MISCELLANEOUS) ×2 IMPLANT
CUFF TOURN SGL QUICK 34 (TOURNIQUET CUFF) ×2
CUFF TRNQT CYL 34X4.125X (TOURNIQUET CUFF) ×1 IMPLANT
DRAPE SHEET LG 3/4 BI-LAMINATE (DRAPES) ×2 IMPLANT
DRAPE U-SHAPE 47X51 STRL (DRAPES) ×2 IMPLANT
DRSG MEPILEX BORDER 4X12 (GAUZE/BANDAGES/DRESSINGS) ×2 IMPLANT
DRSG PAD ABDOMINAL 8X10 ST (GAUZE/BANDAGES/DRESSINGS) ×4 IMPLANT
DURAPREP 26ML APPLICATOR (WOUND CARE) ×4 IMPLANT
ELECT REM PT RETURN 15FT ADLT (MISCELLANEOUS) ×2 IMPLANT
GLOVE BIO SURGEON STRL SZ 6.5 (GLOVE) ×2 IMPLANT
GLOVE BIO SURGEON STRL SZ7.5 (GLOVE) ×2 IMPLANT
GLOVE BIOGEL PI IND STRL 7.0 (GLOVE) ×1 IMPLANT
GLOVE BIOGEL PI IND STRL 8 (GLOVE) ×1 IMPLANT
GLOVE BIOGEL PI INDICATOR 7.0 (GLOVE) ×1
GLOVE BIOGEL PI INDICATOR 8 (GLOVE) ×1
GOWN STRL REIN XL XLG (GOWN DISPOSABLE) ×4 IMPLANT
HANDPIECE INTERPULSE COAX TIP (DISPOSABLE) ×2
HOLDER FOLEY CATH W/STRAP (MISCELLANEOUS) IMPLANT
HOOD PEEL AWAY FLYTE STAYCOOL (MISCELLANEOUS) IMPLANT
IMMOBILIZER KNEE 20 (SOFTGOODS) ×2
IMMOBILIZER KNEE 20 THIGH 36 (SOFTGOODS) ×1 IMPLANT
INSERT TIB ATTUNE FB SZ7X5 (Insert) ×1 IMPLANT
KIT TURNOVER KIT A (KITS) IMPLANT
MANIFOLD NEPTUNE II (INSTRUMENTS) ×2 IMPLANT
NS IRRIG 1000ML POUR BTL (IV SOLUTION) ×2 IMPLANT
PACK TOTAL KNEE CUSTOM (KITS) ×2 IMPLANT
PROTECTOR NERVE ULNAR (MISCELLANEOUS) ×2 IMPLANT
SET HNDPC FAN SPRY TIP SCT (DISPOSABLE) ×1 IMPLANT
SET PAD KNEE POSITIONER (MISCELLANEOUS) ×2 IMPLANT
SPIKE FLUID TRANSFER (MISCELLANEOUS) IMPLANT
SUT VIC AB 1 CT1 36 (SUTURE) ×4 IMPLANT
SUT VIC AB 2-0 CT1 27 (SUTURE) ×4
SUT VIC AB 2-0 CT1 TAPERPNT 27 (SUTURE) ×1 IMPLANT
SUT VIC AB 3-0 SH 8-18 (SUTURE) ×2 IMPLANT
TRAY FOLEY MTR SLVR 16FR STAT (SET/KITS/TRAYS/PACK) ×2 IMPLANT
TUBE SUCTION HIGH CAP CLEAR NV (SUCTIONS) ×2 IMPLANT
WATER STERILE IRR 1000ML POUR (IV SOLUTION) ×4 IMPLANT
WRAP KNEE MAXI GEL POST OP (GAUZE/BANDAGES/DRESSINGS) ×2 IMPLANT

## 2021-08-13 NOTE — Discharge Instructions (Signed)
INSTRUCTIONS AFTER JOINT REPLACEMENT  ? ?Remove items at home which could result in a fall. This includes throw rugs or furniture in walking pathways ?ICE to the affected joint every three hours while awake for 30 minutes at a time, for at least the first 3-5 days, and then as needed for pain and swelling.  Continue to use ice for pain and swelling. You may notice swelling that will progress down to the foot and ankle.  This is normal after surgery.  Elevate your leg when you are not up walking on it.   ?Continue to use the breathing machine you got in the hospital (incentive spirometer) which will help keep your temperature down.  It is common for your temperature to cycle up and down following surgery, especially at night when you are not up moving around and exerting yourself.  The breathing machine keeps your lungs expanded and your temperature down. ? ? ?DIET:  As you were doing prior to hospitalization, we recommend a well-balanced diet. ? ?DRESSING / WOUND CARE / SHOWERING ? ?You may shower 3 days after surgery, but keep the wounds dry during showering.  You may use an occlusive plastic wrap (Press'n Seal for example), NO SOAKING/SUBMERGING IN THE BATHTUB.  If the bandage gets wet, change with a clean dry gauze.  If the incision gets wet, pat the wound dry with a clean towel. ? ?ACTIVITY ? ?Increase activity slowly as tolerated, but follow the weight bearing instructions below.   ?No driving for 6 weeks or until further direction given by your physician.  You cannot drive while taking narcotics.  ?No lifting or carrying greater than 10 lbs. until further directed by your surgeon. ?Avoid periods of inactivity such as sitting longer than an hour when not asleep. This helps prevent blood clots.  ?You may return to work once you are authorized by your doctor.  ? ? ? ?WEIGHT BEARING  ? ?Weight bearing as tolerated with assist device (walker, cane, etc) as directed, use it as long as suggested by your surgeon or  therapist, typically at least 4-6 weeks. ? ? ?EXERCISES ? ?Results after joint replacement surgery are often greatly improved when you follow the exercise, range of motion and muscle strengthening exercises prescribed by your doctor. Safety measures are also important to protect the joint from further injury. Any time any of these exercises cause you to have increased pain or swelling, decrease what you are doing until you are comfortable again and then slowly increase them. If you have problems or questions, call your caregiver or physical therapist for advice.  ? ?Rehabilitation is important following a joint replacement. After just a few days of immobilization, the muscles of the leg can become weakened and shrink (atrophy).  These exercises are designed to build up the tone and strength of the thigh and leg muscles and to improve motion. Often times heat used for twenty to thirty minutes before working out will loosen up your tissues and help with improving the range of motion but do not use heat for the first two weeks following surgery (sometimes heat can increase post-operative swelling).  ? ?These exercises can be done on a training (exercise) mat, on the floor, on a table or on a bed. Use whatever works the best and is most comfortable for you.    Use music or television while you are exercising so that the exercises are a pleasant break in your day. This will make your life better with the exercises acting   as a break in your routine that you can look forward to.   Perform all exercises about fifteen times, three times per day or as directed.  You should exercise both the operative leg and the other leg as well. ? ?Exercises include: ?  ?Quad Sets - Tighten up the muscle on the front of the thigh (Quad) and hold for 5-10 seconds.   ?Straight Leg Raises - With your knee straight (if you were given a brace, keep it on), lift the leg to 60 degrees, hold for 3 seconds, and slowly lower the leg.  Perform this  exercise against resistance later as your leg gets stronger.  ?Leg Slides: Lying on your back, slowly slide your foot toward your buttocks, bending your knee up off the floor (only go as far as is comfortable). Then slowly slide your foot back down until your leg is flat on the floor again.  ?Angel Wings: Lying on your back spread your legs to the side as far apart as you can without causing discomfort.  ?Hamstring Strength:  Lying on your back, push your heel against the floor with your leg straight by tightening up the muscles of your buttocks.  Repeat, but this time bend your knee to a comfortable angle, and push your heel against the floor.  You may put a pillow under the heel to make it more comfortable if necessary.  ? ?A rehabilitation program following joint replacement surgery can speed recovery and prevent re-injury in the future due to weakened muscles. Contact your doctor or a physical therapist for more information on knee rehabilitation.  ? ? ?CONSTIPATION ? ?Constipation is defined medically as fewer than three stools per week and severe constipation as less than one stool per week.  Even if you have a regular bowel pattern at home, your normal regimen is likely to be disrupted due to multiple reasons following surgery.  Combination of anesthesia, postoperative narcotics, change in appetite and fluid intake all can affect your bowels.  ? ?YOU MUST use at least one of the following options; they are listed in order of increasing strength to get the job done.  They are all available over the counter, and you may need to use some, POSSIBLY even all of these options:   ? ?Drink plenty of fluids (prune juice may be helpful) and high fiber foods ?Colace 100 mg by mouth twice a day  ?Senokot for constipation as directed and as needed Dulcolax (bisacodyl), take with full glass of water  ?Miralax (polyethylene glycol) once or twice a day as needed. ? ?If you have tried all these things and are unable to have a  bowel movement in the first 3-4 days after surgery call either your surgeon or your primary doctor.   ? ?If you experience loose stools or diarrhea, hold the medications until you stool forms back up.  If your symptoms do not get better within 1 week or if they get worse, check with your doctor.  If you experience "the worst abdominal pain ever" or develop nausea or vomiting, please contact the office immediately for further recommendations for treatment. ? ? ?ITCHING:  If you experience itching with your medications, try taking only a single pain pill, or even half a pain pill at a time.  You can also use Benadryl over the counter for itching or also to help with sleep.  ? ?TED HOSE STOCKINGS:  Use stockings on both legs until for at least 2 weeks or as directed by   physician office. They may be removed at night for sleeping. ? ?MEDICATIONS:  See your medication summary on the ?After Visit Summary? that nursing will review with you.  You may have some home medications which will be placed on hold until you complete the course of blood thinner medication.  It is important for you to complete the blood thinner medication as prescribed. ? ?PRECAUTIONS:  If you experience chest pain or shortness of breath - call 911 immediately for transfer to the hospital emergency department.  ? ?If you develop a fever greater that 101 F, purulent drainage from wound, increased redness or drainage from wound, foul odor from the wound/dressing, or calf pain - CONTACT YOUR SURGEON.   ?                                                ?FOLLOW-UP APPOINTMENTS:  If you do not already have a post-op appointment, please call the office for an appointment to be seen by your surgeon.  Guidelines for how soon to be seen are listed in your ?After Visit Summary?, but are typically between 1-4 weeks after surgery. ? ?OTHER INSTRUCTIONS:  ? ?Knee Replacement:  Do not place pillow under knee, focus on keeping the knee straight while resting.   ? ?POST-OPERATIVE OPIOID TAPER INSTRUCTIONS: ?It is important to wean off of your opioid medication as soon as possible. If you do not need pain medication after your surgery it is ok to stop day one. ?Opioids i

## 2021-08-13 NOTE — Evaluation (Signed)
Physical Therapy Evaluation ? ?Name: Joseph Noble ?MRN: [id number] ?DOB: 1956-03-24 ?Today's Date: 08/13/2021 ? ?History of Present Illness ? 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 The patient performed ambulation and steps with cues for safety. As patient had right knee  surgery 7 months ago and has to think about now opposite extremity precautions. Patient has met PT goals for DC.    ?   ? ?Recommendations for follow up therapy are one component of a multi-disciplinary discharge planning process, led by the attending physician.  Recommendations may be updated based on patient status, additional functional criteria and insurance authorization. ? ?Follow Up Recommendations Follow physician's recommendations for discharge plan and follow up therapies ? ?  ?Assistance Recommended at Discharge Set up Supervision/Assistance  ?Patient can return home with the following ? Help with stairs or ramp for entrance;A lot of help with bathing/dressing/bathroom;Assist for transportation ? ?  ?Equipment Recommendations None recommended by PT  ?Recommendations for Other Services ?    ?  ?Functional Status Assessment    ? ?  ?Precautions / Restrictions Precautions ?Precautions: Fall;Knee ?Precaution Comments: did not require KI  ? ?  ? ?Mobility ? Bed Mobility ?Overal bed mobility: Modified Independent ?  ?  ?  ?  ?  ?  ?  ?  ? ?Transfers ?Overall transfer level: Needs assistance ?Equipment used: Rolling walker (2 wheels) ?Transfers: Sit to/from Stand ?Sit to Stand: Min guard ?  ?  ?  ?  ?  ?General transfer comment: cues for safety ?  ? ?Ambulation/Gait ?Ambulation/Gait assistance: Min guard ?Gait Distance (Feet): 200 Feet ?Assistive device: Rolling walker (2 wheels) ?Gait Pattern/deviations: Step-to pattern, Step-through pattern ?  ?  ?  ?General Gait Details: cues for sequence ? ?Stairs ?Stairs: Yes ?Stairs assistance: Min guard ?Stair Management: Two rails, Forwards ?Number of Stairs: 2 ?General  stair comments: then 3, reviewed not having any rails, he can  go across grass if needed to no steps. he will stay on first level. patient required cues several times to place LLE down . ? ?Wheelchair Mobility ?  ? ?Modified Rankin (Stroke Patients Only) ?  ? ?  ? ?Balance Overall balance assessment: No apparent balance deficits (not formally assessed) ?  ?  ?  ?  ?  ?  ?  ?  ?  ?  ?  ?  ?  ?  ?  ?  ?  ?  ?   ? ? ? ?Pertinent Vitals/Pain Pain Assessment ?Pain Assessment: 0-10 ?Pain Score: 4  ?Pain Location: left knee ?Pain Descriptors / Indicators: Aching ?Pain Intervention(s): Premedicated before session, Monitored during session  ? ? ?Home Living Family/patient expects to be discharged to:: Private residence ?Living Arrangements: Spouse/significant other ?Available Help at Discharge: Family ?Type of Home: House ?Home Access: Stairs to enter ?Entrance Stairs-Rails: None ?Entrance Stairs-Number of Steps: 2+3 + 1 ?Alternate Level Stairs-Number of Steps: 14 ?Home Layout: Two level ?Home Equipment: Conservation officer, nature (2 wheels);Cane - quad ?   ?  ?Prior Function Prior Level of Function : Independent/Modified Independent ?  ?  ?  ?  ?  ?  ?  ?  ?  ? ? ?Hand Dominance  ?   ? ?  ?Extremity/Trunk Assessment  ? Upper Extremity Assessment ?Upper Extremity Assessment: Overall WFL for tasks assessed ?  ? ?Lower Extremity Assessment ?Lower Extremity Assessment: LLE deficits/detail ?LLE Deficits / Details: + SLR, knee flexion - 70 ?  ? ?Cervical /  Trunk Assessment ?Cervical / Trunk Assessment: Normal  ?Communication  ? Communication: No difficulties  ?Cognition Arousal/Alertness: Awake/alert ?Behavior During Therapy: [facility] for tasks assessed/performed ?Overall Cognitive Status: Within Functional Limits for tasks assessed ?  ?  ?  ?  ?  ?  ?  ?  ?  ?  ?  ?  ?  ?  ?  ?  ?  ?  ?  ? ?  ?General Comments   ? ?  ?Exercises Total Joint Exercises ?Ankle Circles/Pumps: AROM, Both ?Quad Sets: AROM, Left ?Straight Leg Raises: AROM, Left ?Long  Arc Quad: AROM, Left ?Knee Flexion: AROM, Left  ? ?Assessment/Plan  ?  ?PT Assessment All further PT needs can be met in the next venue of care  ?PT Problem List Decreased strength;Decreased knowledge of precautions;Decreased range of motion;Decreased mobility;Decreased knowledge of use of DME;Decreased activity tolerance ? ?   ?  ?PT Treatment Interventions     ? ?PT Goals (Current goals can be found in the Care Plan section)  ?Acute Rehab PT Goals ?Patient Stated Goal: go home ?PT Goal Formulation: All assessment and education complete, DC therapy ? ?  ?Frequency   ?  ? ? ?Co-evaluation   ?  ?  ?  ?  ? ? ?  ?AM-PAC PT "6 Clicks" Mobility  ?Outcome Measure Help needed turning from your back to your side while in a flat bed without using bedrails?: None ?Help needed moving from lying on your back to sitting on the side of a flat bed without using bedrails?: None ?Help needed moving to and from a bed to a chair (including a wheelchair)?: A Little ?Help needed standing up from a chair using your arms (e.g., wheelchair or bedside chair)?: A Little ?Help needed to walk in hospital room?: A Little ?Help needed climbing 3-5 steps with a railing? : A Little ?6 Click Score: 20 ? ?  ?End of Session Equipment Utilized During Treatment: Gait belt ?Activity Tolerance: Patient tolerated treatment well ?Patient left: in bed;with nursing/sitter in room ?Nurse Communication: Mobility status ?PT Visit Diagnosis: Unsteadiness on feet (R26.81);Difficulty in walking, not elsewhere classified (R26.2) ?  ? ?Time: 1259-1340 ?PT Time Calculation (min) (ACUTE ONLY): 41 min ? ? ?Charges:   PT Evaluation ?$PT Eval Low Complexity: 1 Low ?PT Treatments ?$Gait Training: 8-22 mins ?$Therapeutic Exercise: 8-22 mins ?  ?   ? ? ?Joseph Noble PT ?Acute Rehabilitation Services ?Pager [phone] ?Office [phone] ? ? ?Joseph Noble, Shella Maxim ?08/13/2021, 1:55 PM ?

## 2021-08-13 NOTE — Anesthesia Procedure Notes (Signed)
Spinal ? ?Start time: 08/13/2021 7:25 AM ?End time: 08/13/2021 7:27 AM ?Reason for block: surgical anesthesia ?Staffing ?Performed: anesthesiologist  ?Anesthesiologist: Shelton Silvas, MD ?Preanesthetic Checklist ?Completed: patient identified, IV checked, site marked, risks and benefits discussed, surgical consent, monitors and equipment checked, pre-op evaluation and timeout performed ?Spinal Block ?Patient position: sitting ?Prep: DuraPrep and site prepped and draped ?Location: L3-4 ?Injection technique: single-shot ?Needle ?Needle type: Pencan  ?Needle gauge: 24 G ?Needle length: 10 cm ?Needle insertion depth: 10 cm ?Additional Notes ?Patient tolerated well. No immediate complications. ? ?Functioning IV was confirmed and monitors were applied. Sterile prep and drape, including hand hygiene and sterile gloves were used. The patient was positioned and the back was prepped. The skin was anesthetized with lidocaine. Free flow of clear CSF was obtained prior to injecting local anesthetic into the CSF. The spinal needle aspirated freely following injection. The needle was carefully withdrawn. The patient tolerated the procedure well. ? ? ? ? ?

## 2021-08-13 NOTE — Anesthesia Postprocedure Evaluation (Signed)
Anesthesia Post Note ? ?Patient: Joseph Noble ? ?Procedure(s) Performed: TOTAL KNEE ARTHROPLASTY (Left: Knee) ? ?  ? ?Patient location during evaluation: PACU ?Anesthesia Type: Spinal ?Level of consciousness: oriented and awake and alert ?Pain management: pain level controlled ?Vital Signs Assessment: post-procedure vital signs reviewed and stable ?Respiratory status: spontaneous breathing, respiratory function stable and patient connected to nasal cannula oxygen ?Cardiovascular status: blood pressure returned to baseline and stable ?Postop Assessment: no headache, no backache, no apparent nausea or vomiting and spinal receding ?Anesthetic complications: no ? ? ?No notable events documented. ? ?Last Vitals:  ?Vitals:  ? 08/13/21 1200 08/13/21 1345  ?BP: (!) 154/87   ?Pulse: (!) 54   ?Resp:  16  ?Temp:    ?SpO2: 100%   ?  ?Last Pain:  ?Vitals:  ? 08/13/21 1345  ?TempSrc:   ?PainSc: 3   ? ? ?  ?  ?  ?  ?  ?  ? ?Effie Berkshire ? ? ? ? ?

## 2021-08-13 NOTE — Anesthesia Procedure Notes (Signed)
Anesthesia Regional Block: Adductor canal block  ? ?Pre-Anesthetic Checklist: , timeout performed,  Correct Patient, Correct Site, Correct Laterality,  Correct Procedure, Correct Position, site marked,  Risks and benefits discussed,  Surgical consent,  Pre-op evaluation,  At surgeon's request and post-op pain management ? ?Laterality: Left ? ?Prep: chloraprep     ?  ?Needles:  ?Injection technique: Single-shot ? ?Needle Type: Echogenic Stimulator Needle   ? ? ?Needle Length: 9cm  ?Needle Gauge: 21  ? ? ? ?Additional Needles: ? ? ?Procedures:,,,, ultrasound used (permanent image in chart),,    ?Narrative:  ?Start time: 08/13/2021 7:05 AM ?End time: 08/13/2021 7:10 AM ?Injection made incrementally with aspirations every 5 mL. ? ?Performed by: Personally  ?Anesthesiologist: Shelton Silvas, MD ? ?Additional Notes: ?Patient tolerated the procedure well. Local anesthetic introduced in an incremental fashion under minimal resistance after negative aspirations. No paresthesias were elicited. After completion of the procedure, no acute issues were identified and patient continued to be monitored by RN.  ? ? ? ? ? ?

## 2021-08-13 NOTE — Anesthesia Preprocedure Evaluation (Signed)
Anesthesia Evaluation  ?Patient identified by MRN, date of birth, ID band ?Patient awake ? ? ? ?Reviewed: ?Allergy & Precautions, NPO status , Patient's Chart, lab work & pertinent test results ? ?Airway ?Mallampati: II ? ?TM Distance: >3 FB ?Neck ROM: Full ? ? ? Dental ? ?(+) Teeth Intact, Dental Advisory Given ?  ?Pulmonary ?neg pulmonary ROS,  ?  ?breath sounds clear to auscultation ? ? ? ? ? ? Cardiovascular ?hypertension, Pt. on medications and Pt. on home beta blockers ? ?Rhythm:Regular Rate:Normal ? ? ?  ?Neuro/Psych ?negative neurological ROS ? negative psych ROS  ? GI/Hepatic ?negative GI ROS, Neg liver ROS,   ?Endo/Other  ?Hypothyroidism  ? Renal/GU ?negative Renal ROS  ? ?  ?Musculoskeletal ? ?(+) Arthritis ,  ? Abdominal ?Normal abdominal exam  (+)   ?Peds ? Hematology ?negative hematology ROS ?(+)   ?Anesthesia Other Findings ? ? Reproductive/Obstetrics ? ?  ? ? ? ? ? ? ? ? ? ? ? ? ? ?  ?  ? ? ? ? ? ? ? ? ?Anesthesia Physical ?Anesthesia Plan ? ?ASA: 2 ? ?Anesthesia Plan: Spinal  ? ?Post-op Pain Management: Regional block*  ? ?Induction: Intravenous ? ?PONV Risk Score and Plan: 2 and Ondansetron, Propofol infusion and Midazolam ? ?Airway Management Planned: Natural Airway and Simple Face Mask ? ?Additional Equipment: None ? ?Intra-op Plan:  ? ?Post-operative Plan:  ? ?Informed Consent: I have reviewed the patients History and Physical, chart, labs and discussed the procedure including the risks, benefits and alternatives for the proposed anesthesia with the patient or authorized representative who has indicated his/her understanding and acceptance.  ? ? ? ? ? ?Plan Discussed with: CRNA ? ?Anesthesia Plan Comments: (Lab Results ?     Component                Value               Date                 ?     WBC                      10.1                08/06/2021           ?     HGB                      15.1                08/06/2021           ?     HCT                       45.1                08/06/2021           ?     MCV                      89.1                08/06/2021           ?     PLT                      180  08/06/2021           ?)  ? ? ? ? ? ? ?Anesthesia Quick Evaluation ? ?

## 2021-08-13 NOTE — Anesthesia Procedure Notes (Signed)
Procedure Name: [place] ?Date/Time: 08/13/2021 7:18 AM ?Performed by: Niel Hummer, CRNA ?Pre-anesthesia Checklist: Patient identified, Emergency Drugs available, Suction available and Patient being monitored ?Oxygen Delivery Method: Simple face mask ? ? ? ? ?

## 2021-08-13 NOTE — Interval H&P Note (Signed)
History and Physical Interval Note: ? ?08/13/2021 ?7:10 AM ? ?Joseph Noble  has presented today for surgery, with the diagnosis of djd left knee.  The various methods of treatment have been discussed with the patient and family. After consideration of risks, benefits and other options for treatment, the patient has consented to  Procedure(s): ?TOTAL KNEE ARTHROPLASTY (Left) as a surgical intervention.  The patient's history has been reviewed, patient examined, no change in status, stable for surgery.  I have reviewed the patient's chart and labs.  Questions were answered to the patient's satisfaction.   ? ? ?Eulas Post ? ? ?

## 2021-08-13 NOTE — Op Note (Signed)
DATE OF SURGERY:  08/13/2021 ?TIME: 9:21 AM ? ?PATIENT NAME:  Joseph Noble   ?AGE: 66 y.o.  ? ? ?PRE-OPERATIVE DIAGNOSIS: Left knee primary localized osteoarthritis ? ?POST-OPERATIVE DIAGNOSIS:  Same ? ?PROCEDURE: Left total Knee Arthroplasty ? ?SURGEON:  Johnny Bridge, MD  ? ?ASSISTANT:  Merlene Pulling, PA-C, present and scrubbed throughout the case, critical for assistance with exposure, retraction, instrumentation, and closure. ? ? ?OPERATIVE IMPLANTS: Depuy Attune size 7 posterior Stabilized Femur, with a size 7 fixed Bearing Tibia, 5 polyethylene insert with a 38 medialized oval dome polyethylene patella. ? ?PREOPERATIVE INDICATIONS: ? ?Joseph Noble is a 66 y.o. year old male with end stage bone on bone degenerative arthritis of the knee who failed conservative treatment, including injections, antiinflammatories, activity modification, and assistive devices, and had significant impairment of their activities of daily living, and elected for Total Knee Arthroplasty.  ? ?The risks, benefits, and alternatives were discussed at length including but not limited to the risks of infection, bleeding, nerve injury, stiffness, blood clots, the need for revision surgery, cardiopulmonary complications, among others, and they were willing to proceed. ? ?OPERATIVE FINDINGS AND UNIQUE ASPECTS OF THE CASE: I cut the femur on 10, and used a stylus onto for the tibia off of the medial side and just barely skimmed the posterior medial aspect.  He balanced nicely to a size 5.  The patella tracked with no thumb technique.  I did not notch.  The tibia may have been slightly positioned medially, it was a little challenging getting lateral, and I felt like a size 8 would have been stuffing the joint from front to back. ? ?ESTIMATED BLOOD LOSS: 150 mL ? ?OPERATIVE DESCRIPTION: ? ?The patient was brought to the operative room and placed in a supine position.  Anesthesia was administered.  IV antibiotics were given.  The lower  extremity was prepped and draped in the usual sterile fashion.  Time out was performed.  The leg was elevated and exsanguinated and the tourniquet was inflated. ? ?Anterior quadriceps tendon splitting approach was performed.  The patella was everted and osteophytes were removed.  The anterior horn of the medial and lateral meniscus was removed.  ? ?The patella was then measured, and cut with the saw.  The thickness before the cut was 26 and after the cut was 14.  A metal shield was used to protect the patella throughout the case.   ? ?The distal femur was opened with the drill and the intramedullary distal femoral cutting jig was utilized, set at 5 degrees resecting 9 mm off the distal femur.  Care was taken to protect the collateral ligaments. ? ?Then the extramedullary tibial cutting jig was utilized making the appropriate cut using the anterior tibial crest as a reference building in appropriate posterior slope.  Care was taken during the cut to protect the medial and collateral ligaments.  The proximal tibia was removed along with the posterior horns of the menisci.  The PCL was sacrificed.   ? ?The extensor gap was measured and found to have adequate resection, measuring to a size 5.   ? ?The distal femoral sizing jig was applied, taking care to avoid notching.  This was set at 3 degrees of external rotation.  Then the 4-in-1 cutting jig was applied and the anterior and posterior femur was cut, along with the chamfer cuts.  All posterior osteophytes were removed.  The flexion gap was then measured and was symmetric with the extension gap. ? ?  I completed the distal femoral preparation using the appropriate jig to prepare the box. ? ?The proximal tibia sized and prepared accordingly with the reamer and the punch, and then all components were trialed with the poly insert.  The knee was found to have excellent balance and full motion.   ? ?The above named components were then cemented into place and all excess  cement was removed.  The real polyethylene implant was placed.  After the cement had cured I released the tourniquet and confirmed excellent hemostasis with no major posterior vessel injury.   ? ?The knee was easily taken through a range of motion and the patella tracked well and the knee irrigated copiously and the parapatellar and subcutaneous tissue closed with vicryl, and monocryl with steri strips for the skin.  The wounds were injected with marcaine, and dressed with sterile gauze and the patient was awakened and returned to the PACU in stable and satisfactory condition.  There were no complications. ? ?Total tourniquet time was 80 minutes. ? ? ? ? ? ? ? ? ? ? ? ? ? ? ? ?

## 2021-08-13 NOTE — Transfer of Care (Signed)
Immediate Anesthesia Transfer of Care Note ? ?Patient: Joseph Noble ? ?Procedure(s) Performed: TOTAL KNEE ARTHROPLASTY (Left: Knee) ? ?Patient Location: PACU ? ?Anesthesia Type:Spinal ? ?Level of Consciousness: drowsy ? ?Airway & Oxygen Therapy: Patient Spontanous Breathing and Patient connected to face mask oxygen ? ?Post-op Assessment: Report given to RN and Post -op Vital signs reviewed and stable ? ?Post vital signs: Reviewed and stable ? ?Last Vitals:  ?Vitals Value Taken Time  ?BP 127/69 08/13/21 1000  ?Temp    ?Pulse 57 08/13/21 1001  ?Resp 11 08/13/21 1001  ?SpO2 98 % 08/13/21 1001  ?Vitals shown include unvalidated device data. ? ?Last Pain:  ?Vitals:  ? 08/13/21 0552  ?TempSrc:   ?PainSc: 0-No pain  ?   ? ?  ? ?Complications: No notable events documented. ?

## 2021-08-14 ENCOUNTER — Encounter (HOSPITAL_COMMUNITY): Payer: Self-pay | Admitting: Orthopedic Surgery

## 2021-08-14 ENCOUNTER — Other Ambulatory Visit (HOSPITAL_COMMUNITY): Payer: Self-pay | Admitting: Orthopedic Surgery

## 2021-08-14 ENCOUNTER — Ambulatory Visit (HOSPITAL_COMMUNITY)
Admission: RE | Admit: 2021-08-14 | Discharge: 2021-08-14 | Disposition: A | Discharge status: Home / Self Care | Payer: Managed Care, Other (non HMO) | Source: Ambulatory Visit | Attending: Orthopedic Surgery | Admitting: Orthopedic Surgery

## 2021-08-14 DIAGNOSIS — M7989 Other specified soft tissue disorders: Secondary | ICD-10-CM | POA: Diagnosis not present

## 2021-08-14 DIAGNOSIS — M79605 Pain in left leg: Secondary | ICD-10-CM | POA: Diagnosis not present

## 2021-08-14 NOTE — Progress Notes (Signed)
Left lower extremity venous duplex has been completed. ?Preliminary results can be found in CV Proc through chart review.  ?Results were given to Merlene Pulling PA. ? ?08/14/21 3:53 PM ?Carlos Levering RVT   ?

## 2021-08-15 ENCOUNTER — Encounter (HOSPITAL_COMMUNITY): Payer: Managed Care, Other (non HMO)

## 2023-02-22 IMAGING — DX DG KNEE 1-2V PORT*L*
2 series · 2 of 2 positions shown · non-contrast
Comparison: None Available.

CLINICAL DATA: Status post left total knee replacement

EXAM:
PORTABLE LEFT KNEE - 1-2 VIEW

[knee lat]
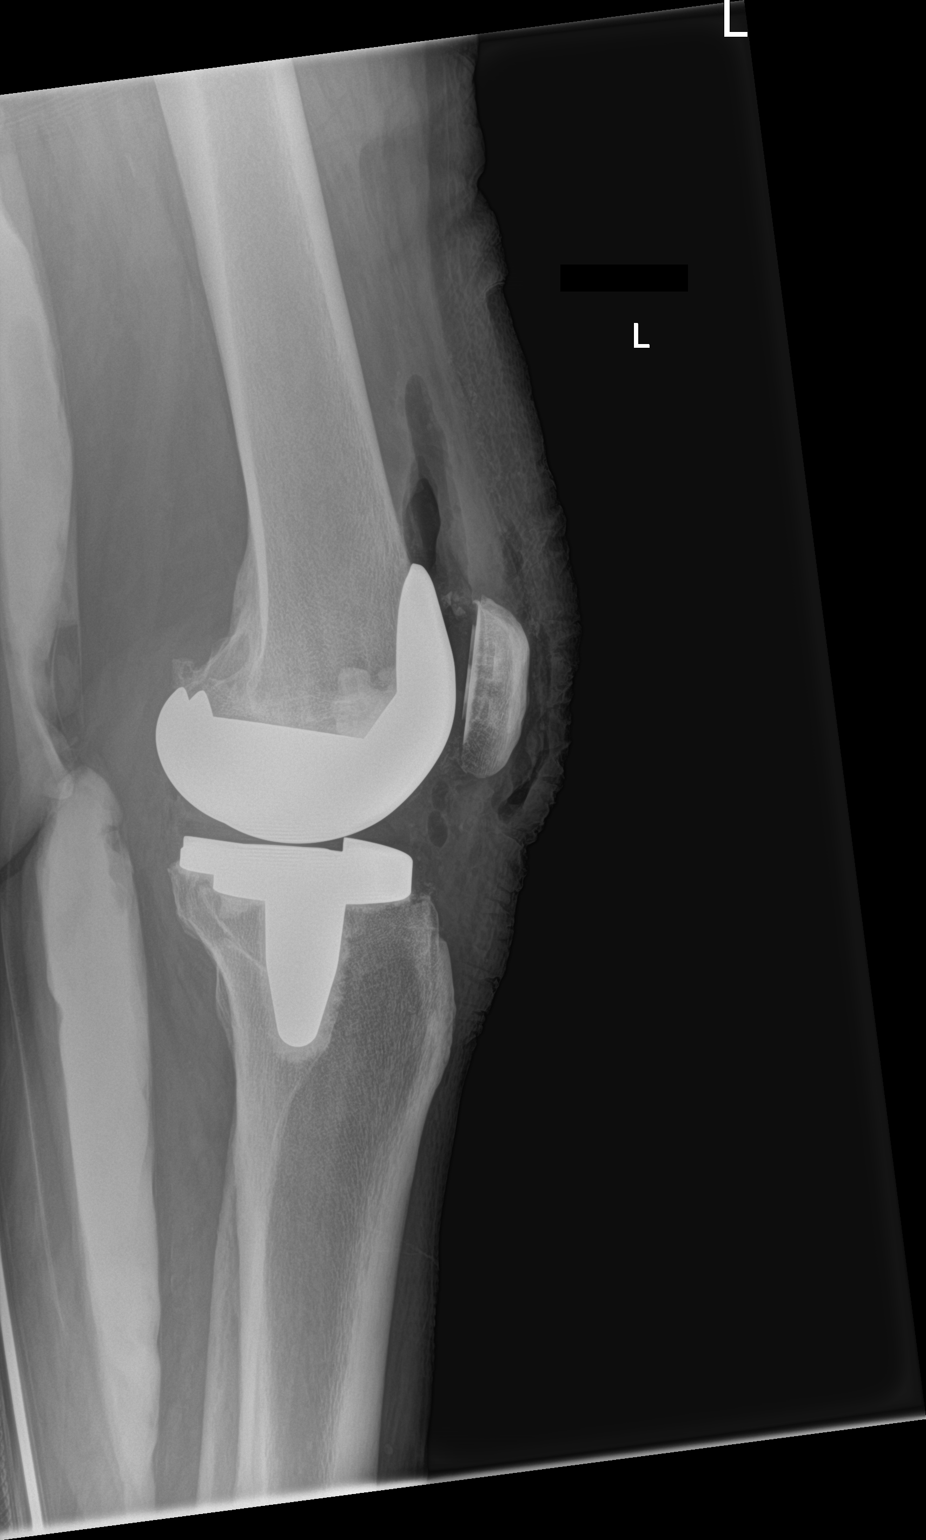

[knee ap]
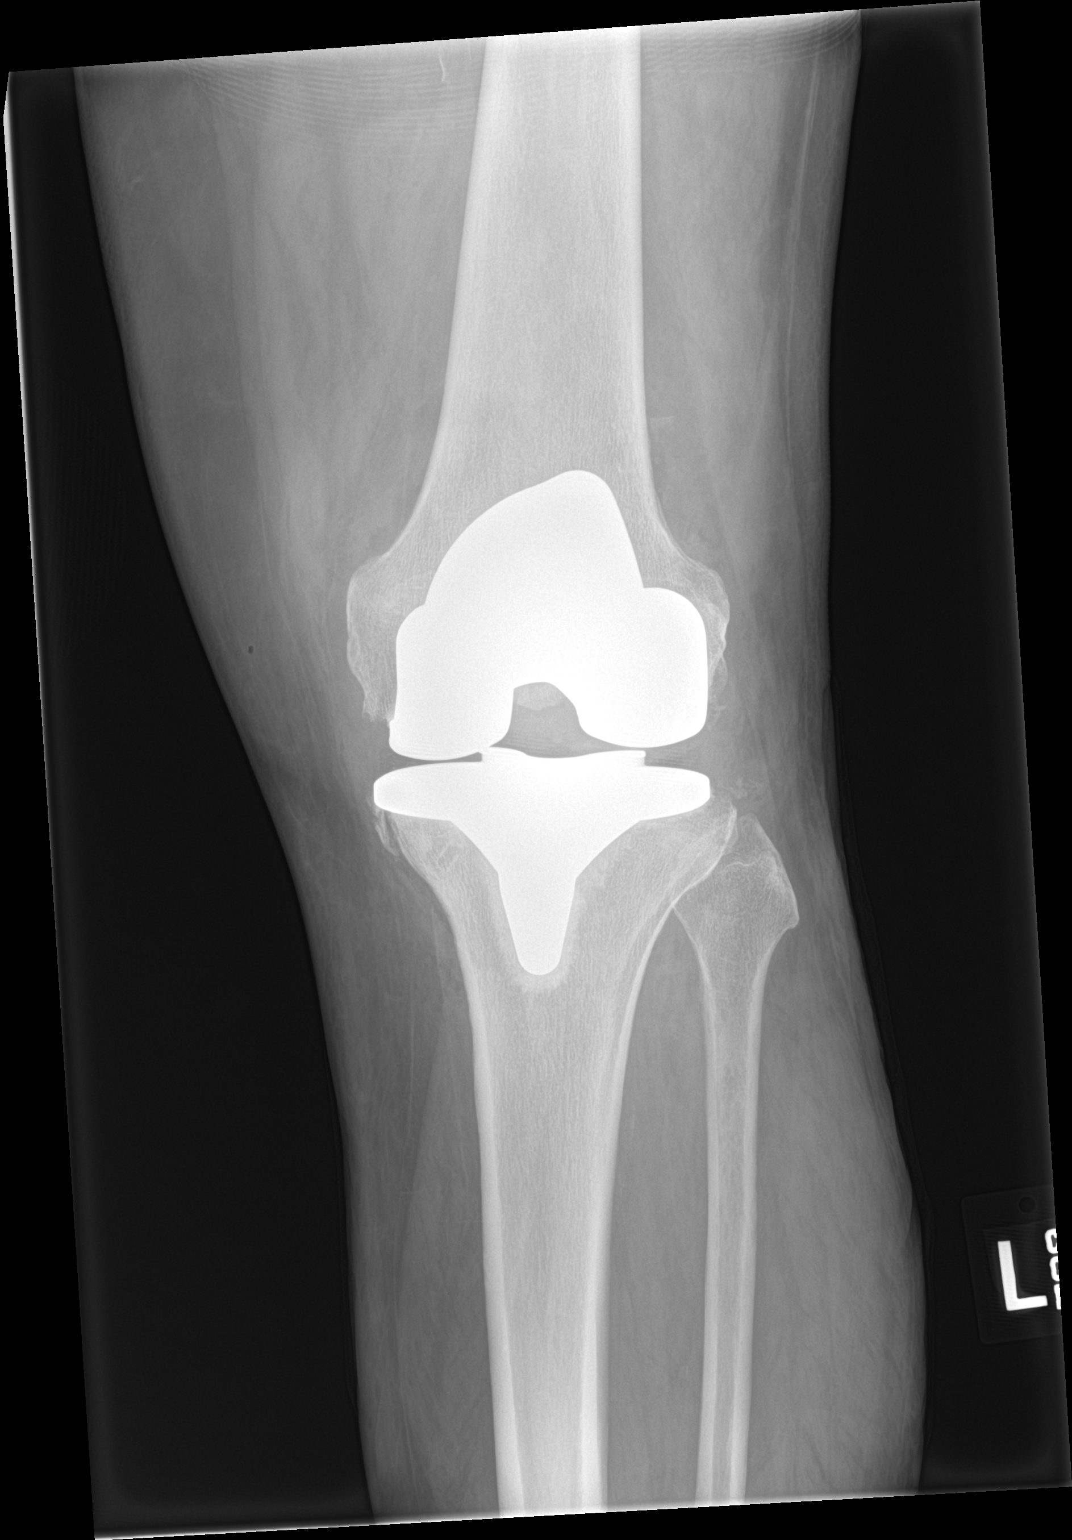

[2 of 2 positions shown; findings below may reference images not displayed]

FINDINGS: Surgical changes of total knee arthroplasty. Small linear bone
fragment at the medial aspect of the tibial plateau. Gas is present
within the prepatellar and suprapatellar soft tissues consistent
with recent surgical intervention. No fracture or malalignment.
IMPRESSION: Postoperative changes of total knee arthroplasty. Small osseous
fragments at the medial margin of the tibial plateau may represent a
minimal amount of periprosthetic fracture.
